# Patient Record
Sex: Male | Born: 1980 | Hispanic: Yes | Smoking: Current every day smoker
Health system: Southern US, Community
[De-identification: ages and names within clinical notes are randomized; demographics above are authoritative.]

## PROBLEM LIST (undated history)

## (undated) DIAGNOSIS — S92502B Displaced unspecified fracture of left lesser toe(s), initial encounter for open fracture: Secondary | ICD-10-CM

## (undated) HISTORY — PX: NO PAST SURGERIES: SHX2092

---

## 2016-03-17 ENCOUNTER — Encounter (HOSPITAL_COMMUNITY): Payer: Self-pay

## 2016-03-17 ENCOUNTER — Emergency Department (HOSPITAL_COMMUNITY): Payer: Self-pay

## 2016-03-17 DIAGNOSIS — S92502B Displaced unspecified fracture of left lesser toe(s), initial encounter for open fracture: Secondary | ICD-10-CM

## 2016-03-17 DIAGNOSIS — Y999 Unspecified external cause status: Secondary | ICD-10-CM | POA: Insufficient documentation

## 2016-03-17 DIAGNOSIS — Y929 Unspecified place or not applicable: Secondary | ICD-10-CM | POA: Insufficient documentation

## 2016-03-17 DIAGNOSIS — S92532B Displaced fracture of distal phalanx of left lesser toe(s), initial encounter for open fracture: Secondary | ICD-10-CM | POA: Insufficient documentation

## 2016-03-17 DIAGNOSIS — W311XXA Contact with metalworking machines, initial encounter: Secondary | ICD-10-CM | POA: Insufficient documentation

## 2016-03-17 DIAGNOSIS — Z23 Encounter for immunization: Secondary | ICD-10-CM | POA: Insufficient documentation

## 2016-03-17 DIAGNOSIS — Y9389 Activity, other specified: Secondary | ICD-10-CM | POA: Insufficient documentation

## 2016-03-17 HISTORY — DX: Displaced unspecified fracture of left lesser toe(s), initial encounter for open fracture: S92.502B

## 2016-03-17 NOTE — ED Triage Notes (Signed)
Pt was working today with a saw. The saw cut through the shoe on his left foot and lacerated his pinky toe. Bleeding controlled. A&Ox4.

## 2016-03-18 ENCOUNTER — Emergency Department (HOSPITAL_COMMUNITY)
Admission: EM | Admit: 2016-03-18 | Discharge: 2016-03-18 | Disposition: A | Discharge status: Home / Self Care | Payer: Self-pay | Attending: Emergency Medicine | Admitting: Emergency Medicine

## 2016-03-18 ENCOUNTER — Encounter (HOSPITAL_COMMUNITY): Payer: Self-pay

## 2016-03-18 DIAGNOSIS — Z5189 Encounter for other specified aftercare: Secondary | ICD-10-CM

## 2016-03-18 DIAGNOSIS — Z4801 Encounter for change or removal of surgical wound dressing: Secondary | ICD-10-CM | POA: Insufficient documentation

## 2016-03-18 DIAGNOSIS — S92502B Displaced unspecified fracture of left lesser toe(s), initial encounter for open fracture: Secondary | ICD-10-CM

## 2016-03-18 MED ORDER — CEPHALEXIN 500 MG PO CAPS: 500.0000 mg | ORAL_CAPSULE | Freq: Four times a day (QID) | ORAL | 0 refills | Status: DC

## 2016-03-18 MED ORDER — OXYCODONE-ACETAMINOPHEN 5-325 MG PO TABS: 1.0000 | ORAL_TABLET | Freq: Four times a day (QID) | ORAL | 0 refills | Status: DC | PRN

## 2016-03-18 MED ORDER — CEFAZOLIN IN D5W 1 GM/50ML IV SOLN
1.0000 g | Freq: Once | INTRAVENOUS | Status: AC
  Administered 2016-03-18: 1 g via INTRAVENOUS
  Filled 2016-03-18: qty 50

## 2016-03-18 MED ORDER — IBUPROFEN 400 MG PO TABS
600.0000 mg | ORAL_TABLET | Freq: Once | ORAL | Status: AC
  Administered 2016-03-18: 600 mg via ORAL
  Filled 2016-03-18: qty 1

## 2016-03-18 MED ORDER — TETANUS-DIPHTH-ACELL PERTUSSIS 5-2.5-18.5 LF-MCG/0.5 IM SUSP
0.5000 mL | Freq: Once | INTRAMUSCULAR | Status: AC
  Administered 2016-03-18: 0.5 mL via INTRAMUSCULAR
  Filled 2016-03-18: qty 0.5

## 2016-03-18 MED ORDER — CEPHALEXIN 250 MG PO CAPS
500.0000 mg | ORAL_CAPSULE | Freq: Once | ORAL | Status: AC
  Administered 2016-03-18: 500 mg via ORAL
  Filled 2016-03-18: qty 2

## 2016-03-18 MED ORDER — CIPROFLOXACIN HCL 500 MG PO TABS: 500.0000 mg | ORAL_TABLET | Freq: Two times a day (BID) | ORAL | 0 refills | Status: DC

## 2016-03-18 MED ORDER — OXYCODONE-ACETAMINOPHEN 5-325 MG PO TABS
1.0000 | ORAL_TABLET | Freq: Once | ORAL | Status: AC
  Administered 2016-03-18: 1 via ORAL
  Filled 2016-03-18: qty 1

## 2016-03-18 MED ORDER — LIDOCAINE HCL 2 % IJ SOLN
10.0000 mL | Freq: Once | INTRAMUSCULAR | Status: AC
  Administered 2016-03-18: 200 mg via INTRADERMAL
  Filled 2016-03-18: qty 20

## 2016-03-18 MED ORDER — HYDROMORPHONE HCL 1 MG/ML IJ SOLN
1.0000 mg | Freq: Once | INTRAMUSCULAR | Status: AC
  Administered 2016-03-18: 1 mg via INTRAVENOUS
  Filled 2016-03-18: qty 1

## 2016-03-18 NOTE — ED Triage Notes (Signed)
Pt states that he cut his pinky toe on L foot yesterday around 1130, with a wood cutter, seen yesterday and told to follow up with Ortho, but unable to follow up with ortho till tomorrow. Pt states the pain is unbearable, pt is spanish speaking only. Pain 8/10

## 2016-03-18 NOTE — ED Notes (Signed)
Pt is soaking foot in betadine and warm water, family at bedside

## 2016-03-18 NOTE — ED Provider Notes (Signed)
[facility]-EMERGENCY DEPT Provider Note   CSN: [id number] Arrival date & time: 03/17/16  2119  By signing my name below, I, Soijett Blue, attest that this documentation has been prepared under the direction and in the presence of Fayrene Helper, PA-C Electronically Signed: Soijett Blue, ED Scribe. 03/18/16. 1:26 AM.   History   Chief Complaint Chief Complaint  Patient presents with  . Extremity Laceration    HPI Trevor Dixon is a 35 y.o. male who presents to the Emergency Department complaining of left foot laceration onset >12 hrs ago. Pt notes that he was working with a saw when the saw cut through his shoe and cut his left pinky toe. He states that he is having associated symptoms of severe sharp throbbing pain. He states that he has tried cleaning with peroxide with no relief for his symptoms. He denies color change, joint swelling, numbness and any other symptoms. Denies taking blood thinners at this time. Denies allergies to any medications. He went to Urgent Care center but was sent here for further care.  Unable to recall last tetanus status.     The history is provided by the patient. No language interpreter was used.    History reviewed. No pertinent past medical history.  There are no active problems to display for this patient.   No past surgical history on file.     Home Medications    Prior to Admission medications   Not on File    Family History History reviewed. No pertinent family history.  Social History Social History  Substance Use Topics  . Smoking status: Not on file  . Smokeless tobacco: Not on file  . Alcohol use Not on file     Allergies   Review of patient's allergies indicates not on file.   Review of Systems Review of Systems  Skin: Positive for wound (left pinky toe laceration).  All other systems reviewed and are negative.    Physical Exam Updated Vital Signs BP 158/99   Pulse 71   Temp 98.7 F (37.1 C) (Oral)   Resp 18    SpO2 99%   Physical Exam  Constitutional: He appears well-developed and well-nourished. No distress.  HENT:  Head: Atraumatic.  Eyes: Conjunctivae are normal.  Neck: Neck supple.  Musculoskeletal: He exhibits tenderness (L pinky toe: 3cm deep laceration to lateral aspect of proximal toe. toe is mildly dusky in appearance.  Cap refill 2sec, toe is flexed, unable to extend toe.  sensation is intact.).  Neurological: He is alert.  Skin: No rash noted.  Psychiatric: He has a normal mood and affect.  Nursing note and vitals reviewed.          ED Treatments / Results  DIAGNOSTIC STUDIES: Oxygen Saturation is 99% on RA, nl by my interpretation.    COORDINATION OF CARE: 1:28 AM Discussed treatment plan with pt at bedside which includes left fifth toe xray, wound care and pt agreed to plan.   Labs (all labs ordered are listed, but only abnormal results are displayed) Labs Reviewed - No data to display  EKG  EKG Interpretation None       Radiology Dg Toe 5th Left  Result Date: 03/18/2016 CLINICAL DATA:  35 year old male with trauma to the left fifth toe with a chain saw. EXAM: DG TOE 5TH LEFT COMPARISON:  None. FINDINGS: There is an incomplete fracture of the lateral cortex of the distal aspect of the proximal phalanx of the fifth digit. Multiple small bony fragment noted in  the lateral soft tissues of the toe. There is no dislocation. No arthritic changes. There is soft tissue swelling and laceration of the skin of the fifth digit. IMPRESSION: Incomplete fracture of the distal aspect of the proximal phalanx of the fifth digit with multiple small bone fragments in the adjacent soft tissue. Electronically Signed   By: Elgie CollardArash  Radparvar M.D.   On: 03/18/2016 01:31    Procedures Procedures (including critical care time)  LACERATION REPAIR Performed by: Fayrene HelperRAN,Taro Hidrogo Authorized by: Fayrene HelperRAN,Eldon Zietlow Consent: Verbal consent obtained. Risks and benefits: risks, benefits and alternatives  were discussed Consent given by: patient Patient identity confirmed: provided demographic data Prepped and Draped in normal sterile fashion Wound explored  Laceration Location: L little toe, dorsal  Laceration Length: 3cm, deep with bony exposure and tendon laceration  No Foreign Bodies seen or palpated  Anesthesia: digital nerve block  Local anesthetic: lidocaine 2% w/o epinephrine  Anesthetic total: 6 ml  Irrigation method: syringe Amount of cleaning: standard  Skin closure: none  Number of sutures: none  Technique: thoroughly irrigated, and non occlusive dressing applied.  Patient tolerance: Patient tolerated the procedure well with no immediate complications.   Medications Ordered in ED Medications  lidocaine (XYLOCAINE) 2 % (with pres) injection 200 mg (not administered)  Tdap (BOOSTRIX) injection 0.5 mL (0.5 mLs Intramuscular Given 03/18/16 0240)  HYDROmorphone (DILAUDID) injection 1 mg (1 mg Intravenous Given 03/18/16 0243)  ceFAZolin (ANCEF) IVPB 1 g/50 mL premix (1 g Intravenous New Bag/Given 03/18/16 0324)     Initial Impression / Assessment and Plan / ED Course  I have reviewed the triage vital signs and the nursing notes.  Pertinent labs & imaging results that were available during my care of the patient were reviewed by me and considered in my medical decision making (see chart for details).  Clinical Course    BP 143/82   Pulse 65   Temp 98.7 F (37.1 C) (Oral)   Resp 18   SpO2 99%    Final Clinical Impressions(s) / ED Diagnoses   Final diagnoses:  Open fracture of fifth toe of left foot, initial encounter    New Prescriptions New Prescriptions   CIPROFLOXACIN (CIPRO) 500 MG TABLET    Take 1 tablet (500 mg total) by mouth 2 (two) times daily. One po bid x 7 days   OXYCODONE-ACETAMINOPHEN (PERCOCET/ROXICET) 5-325 MG TABLET    Take 1-2 tablets by mouth every 6 (six) hours as needed for moderate pain or severe pain.   I personally performed  the services described in this documentation, which was scribed in my presence. The recorded information has been reviewed and is accurate.     2:33 AM Pt suffered a laceration to his L little toe.  Xray demonstrates incomplete fracture of the distal aspect of the proximal phalanx of the fifth digit with multiple small bone fragments in the adjacent soft tissue.  Pt unable to extend his toe, which is concern for tendon/ligamentous injury.  Plan to consult ortho for further management.  Care discussed with Dr. Preston FleetingGlick  4:29 AM Appreciate consultation from orthopedist Dr. Roda ShuttersXu who request wound to be thoroughly cleansed, dressed and for pt to f/u outpt for further care.  Recommend pain medication and cipro coverage.  Pt also made aware there's a potential he may lose his toe.  Pt is aware and agree with plan.     Fayrene HelperBowie Tereka Thorley, PA-C 03/18/16 [phone]    Dione Boozeavid Glick, MD 03/18/16 [phone]

## 2016-03-18 NOTE — Discharge Instructions (Signed)
Continue the medications you are taking in addition to the ones we give you.  Follow up with the orthopedic doctor tomorrow as scheduled.

## 2016-03-18 NOTE — ED Provider Notes (Signed)
[facility]-EMERGENCY DEPT Provider Note   CSN: [id number] Arrival date & time: 03/18/16  1955   By signing my name below, I, Christel MormonMatthew Jamison, attest that this documentation has been prepared under the direction and in the presence of Kerrie BuffaloHope Ameria Sanjurjo, NP. Electronically Signed: Christel MormonMatthew Jamison, Scribe. 03/18/2016. 9:46 PM.   History   Chief Complaint Chief Complaint  Patient presents with  . Toe Injury   The history is provided by the patient and a relative. The history is limited by a language barrier. No language interpreter was used.   HPI Comments:  Trevor Dixon is a 35 y.o. male who presents to the Emergency Department s/p an injury to his 5th L toe that occurred yesterday at 1130. Pt reports that he sustained the injury from a wood cutter and is now having unbearable pain. [facility] and was told that he had a broken toe. Pt had an x-ray, had the wound cleaned, and was given hydrocodone and Cipro. Per daughter, pt came to the ED because of his severe, worsening pain today. Pt has not taken any ibuprofen. Pt has an appointment tomorrow with ortho.    Past Medical History:  Diagnosis Date  . Fracture of fifth toe, left, open 03/17/2016    There are no active problems to display for this patient.   Past Surgical History:  Procedure Laterality Date  . NO PAST SURGERIES         Home Medications    Prior to Admission medications   Medication Sig Start Date End Date Taking? Authorizing Provider  cephALEXin (KEFLEX) 500 MG capsule Take 1 capsule (500 mg total) by mouth 4 (four) times daily. 03/18/16   Witney Huie Orlene OchM Girtha Kilgore, NP  ciprofloxacin (CIPRO) 500 MG tablet Take 1 tablet (500 mg total) by mouth 2 (two) times daily. One po bid x 7 days 03/18/16   Fayrene HelperBowie Tran, PA-C  oxyCODONE-acetaminophen (PERCOCET/ROXICET) 5-325 MG tablet Take 1-2 tablets by mouth every 6 (six) hours as needed for moderate pain or severe pain. 03/18/16   Fayrene HelperBowie Tran, PA-C    Family History No family  history on file.  Social History Social History  Substance Use Topics  . Smoking status: Never Smoker  . Smokeless tobacco: Never Used  . Alcohol use Yes     Comment: occasionally     Allergies   Review of patient's allergies indicates no known allergies.   Review of Systems Review of Systems  Skin: Positive for wound.  All other systems reviewed and are negative.    Physical Exam Updated Vital Signs BP 136/84 (BP Location: Right Arm)   Pulse 78   Temp 98.8 F (37.1 C)   Resp 16   SpO2 99%   Physical Exam  Constitutional: He appears well-developed and well-nourished. No distress.  HENT:  Head: Normocephalic and atraumatic.  Eyes: Conjunctivae are normal.  Cardiovascular: Normal rate.   Pulmonary/Chest: Effort normal.  Abdominal: He exhibits no distension.  Neurological: He is alert.  Skin: Skin is warm and dry.  Swelling and laceration to dorsum of R little toe. Open wound. Erythema that extends to dorsum of foot but does not extend to the ankle.   Psychiatric: He has a normal mood and affect.  Nursing note and vitals reviewed.    ED Treatments / Results  DIAGNOSTIC STUDIES:  Oxygen Saturation is 100% on RA, normal by my interpretation.    COORDINATION OF CARE:  9:46 PM Discussed treatment plan with pt at bedside and pt  agreed to plan.   Labs (all labs ordered are listed, but only abnormal results are displayed) Labs Reviewed - No data to display  Radiology Dg Toe 5th Left  Result Date: 03/18/2016 CLINICAL DATA:  35 year old male with trauma to the left fifth toe with a chain saw. EXAM: DG TOE 5TH LEFT COMPARISON:  None. FINDINGS: There is an incomplete fracture of the lateral cortex of the distal aspect of the proximal phalanx of the fifth digit. Multiple small bony fragment noted in the lateral soft tissues of the toe. There is no dislocation. No arthritic changes. There is soft tissue swelling and laceration of the skin of the fifth digit.  IMPRESSION: Incomplete fracture of the distal aspect of the proximal phalanx of the fifth digit with multiple small bone fragments in the adjacent soft tissue. Electronically Signed   By: Elgie Collard M.D.   On: 03/18/2016 01:31    Procedures Procedures (including critical care time)  Medications Ordered in ED Medications  cephALEXin (KEFLEX) capsule 500 mg (500 mg Oral Given 03/18/16 2246)  oxyCODONE-acetaminophen (PERCOCET/ROXICET) 5-325 MG per tablet 1 tablet (1 tablet Oral Given 03/18/16 2246)  ibuprofen (ADVIL,MOTRIN) tablet 600 mg (600 mg Oral Given 03/18/16 2246)     Initial Impression / Assessment and Plan / ED Course  I have reviewed the triage vital signs and the nursing notes.  Pertinent labs & imaging results that were available during my care of the patient were reviewed by me and considered in my medical decision making (see chart for details).  Clinical Course   Soaked the foot in NSS, and cleaned the wound, dressing reapplied, pain management and keep appointment with Ortho will add Keflex to the current antibiotic. Final Clinical Impressions(s) / ED Diagnoses  35 y.o. male stable for d/c.  Final diagnoses:  Visit for wound check    New Prescriptions Discharge Medication List as of 03/18/2016 10:59 PM    START taking these medications   Details  cephALEXin (KEFLEX) 500 MG capsule Take 1 capsule (500 mg total) by mouth 4 (four) times daily., Starting Wed 03/18/2016, Print      I personally performed the services described in this documentation, which was scribed in my presence. The recorded information has been reviewed and is accurate.     [place], [place] 03/19/16 1554    Maia Plan, MD 03/19/16 [phone]

## 2016-03-18 NOTE — ED Provider Notes (Deleted)
35 year old male suffered a laceration to his left fifth toe with a chainsaw. On exam, there is laceration over the lateral dorsal aspect of the left fifth toe. X-ray does show a fracture so we will need to be treated as an open fracture. Is given antibiotics and tetanus immunization. He'll be given antibiotics and referred to orthopedics.  Medical screening examination/treatment/procedure(s) were conducted as a shared visit with non-physician practitioner(s) and myself.  I personally evaluated the patient during the encounter.     Dione Boozeavid Shatasia Cutshaw, MD 03/18/16 [phone]

## 2016-03-18 NOTE — ED Notes (Signed)
Pt wheeled to significant other's car.  Used interpreter to discuss discharge instructions.  Verbalized understanding of discharge instructions.

## 2016-03-18 NOTE — ED Notes (Signed)
See np assessment 

## 2016-03-18 NOTE — ED Notes (Signed)
Lidocaine and laceration tray at bedside if needed.

## 2016-03-18 NOTE — Discharge Instructions (Signed)
You have a broken left toe.  Please take antibiotic as prescribe and pain medication as needed.  Follow up with orthopedist Dr. Roda ShuttersXu today or tomorrow for further care.

## 2016-03-19 ENCOUNTER — Other Ambulatory Visit: Payer: Self-pay | Admitting: Orthopaedic Surgery

## 2016-03-19 ENCOUNTER — Encounter (HOSPITAL_BASED_OUTPATIENT_CLINIC_OR_DEPARTMENT_OTHER): Payer: Self-pay | Admitting: *Deleted

## 2016-03-19 NOTE — Pre-Procedure Instructions (Signed)
Marlene will be interpreter for pt., per Judy at [facility]; please call [phone] if surgery time changes. 

## 2016-03-20 ENCOUNTER — Encounter (HOSPITAL_BASED_OUTPATIENT_CLINIC_OR_DEPARTMENT_OTHER)
Admission: RE | Disposition: A | Discharge status: Home / Self Care | Payer: Self-pay | Source: Ambulatory Visit | Attending: Orthopaedic Surgery

## 2016-03-20 ENCOUNTER — Encounter (HOSPITAL_BASED_OUTPATIENT_CLINIC_OR_DEPARTMENT_OTHER): Payer: Self-pay | Admitting: Certified Registered"

## 2016-03-20 ENCOUNTER — Ambulatory Visit (HOSPITAL_BASED_OUTPATIENT_CLINIC_OR_DEPARTMENT_OTHER): Payer: Self-pay | Admitting: Certified Registered"

## 2016-03-20 ENCOUNTER — Ambulatory Visit (HOSPITAL_COMMUNITY)
Admission: RE | Admit: 2016-03-20 | Discharge: 2016-03-20 | Disposition: A | Discharge status: Home / Self Care | Payer: Self-pay | Source: Ambulatory Visit | Attending: Orthopaedic Surgery | Admitting: Orthopaedic Surgery

## 2016-03-20 DIAGNOSIS — Z79899 Other long term (current) drug therapy: Secondary | ICD-10-CM | POA: Insufficient documentation

## 2016-03-20 DIAGNOSIS — Y939 Activity, unspecified: Secondary | ICD-10-CM | POA: Insufficient documentation

## 2016-03-20 DIAGNOSIS — X58XXXA Exposure to other specified factors, initial encounter: Secondary | ICD-10-CM | POA: Insufficient documentation

## 2016-03-20 DIAGNOSIS — S92512B Displaced fracture of proximal phalanx of left lesser toe(s), initial encounter for open fracture: Secondary | ICD-10-CM | POA: Insufficient documentation

## 2016-03-20 HISTORY — DX: Displaced unspecified fracture of left lesser toe(s), initial encounter for open fracture: S92.502B

## 2016-03-20 HISTORY — PX: I & D EXTREMITY: SHX5045

## 2016-03-20 SURGERY — IRRIGATION AND DEBRIDEMENT EXTREMITY
Anesthesia: General | Site: Foot | Laterality: Left

## 2016-03-20 MED ORDER — DEXAMETHASONE SODIUM PHOSPHATE 10 MG/ML IJ SOLN
INTRAMUSCULAR | Status: DC | PRN
  Administered 2016-03-20: 10 mg via INTRAVENOUS

## 2016-03-20 MED ORDER — ONDANSETRON HCL 4 MG/2ML IJ SOLN
INTRAMUSCULAR | Status: AC
  Filled 2016-03-20: qty 2

## 2016-03-20 MED ORDER — HYDROMORPHONE HCL 1 MG/ML IJ SOLN
INTRAMUSCULAR | Status: AC
  Filled 2016-03-20: qty 1

## 2016-03-20 MED ORDER — CIPROFLOXACIN IN D5W 400 MG/200ML IV SOLN
400.0000 mg | Freq: Once | INTRAVENOUS | Status: AC
  Administered 2016-03-20: 400 mg via INTRAVENOUS

## 2016-03-20 MED ORDER — BUPIVACAINE HCL 0.25 % IJ SOLN
INTRAMUSCULAR | Status: DC | PRN
  Administered 2016-03-20: 2 mL

## 2016-03-20 MED ORDER — MUPIROCIN 2 % EX OINT
TOPICAL_OINTMENT | CUTANEOUS | Status: AC
  Filled 2016-03-20: qty 22

## 2016-03-20 MED ORDER — SODIUM CHLORIDE 0.9 % IR SOLN
Status: DC | PRN
  Administered 2016-03-20: 3000 mL

## 2016-03-20 MED ORDER — OXYCODONE HCL 5 MG PO TABS
ORAL_TABLET | ORAL | Status: AC
  Filled 2016-03-20: qty 1

## 2016-03-20 MED ORDER — MIDAZOLAM HCL 2 MG/2ML IJ SOLN
1.0000 mg | INTRAMUSCULAR | Status: DC | PRN
  Administered 2016-03-20: 2 mg via INTRAVENOUS

## 2016-03-20 MED ORDER — ONDANSETRON HCL 4 MG PO TABS: 4.0000 mg | ORAL_TABLET | Freq: Three times a day (TID) | ORAL | 0 refills | Status: DC | PRN

## 2016-03-20 MED ORDER — LIDOCAINE HCL (CARDIAC) 20 MG/ML IV SOLN
INTRAVENOUS | Status: DC | PRN
  Administered 2016-03-20: 60 mg via INTRAVENOUS

## 2016-03-20 MED ORDER — GLYCOPYRROLATE 0.2 MG/ML IJ SOLN: 0.2000 mg | Freq: Once | INTRAMUSCULAR | Status: DC | PRN

## 2016-03-20 MED ORDER — MIDAZOLAM HCL 2 MG/2ML IJ SOLN
INTRAMUSCULAR | Status: AC
  Filled 2016-03-20: qty 2

## 2016-03-20 MED ORDER — HYDROCODONE-ACETAMINOPHEN 7.5-325 MG PO TABS: 1.0000 | ORAL_TABLET | Freq: Four times a day (QID) | ORAL | 0 refills | Status: DC | PRN

## 2016-03-20 MED ORDER — OXYCODONE HCL 5 MG PO TABS
5.0000 mg | ORAL_TABLET | Freq: Once | ORAL | Status: AC
  Administered 2016-03-20: 5 mg via ORAL

## 2016-03-20 MED ORDER — FENTANYL CITRATE (PF) 100 MCG/2ML IJ SOLN
50.0000 ug | INTRAMUSCULAR | Status: DC | PRN
  Administered 2016-03-20 (×2): 50 ug via INTRAVENOUS

## 2016-03-20 MED ORDER — SENNOSIDES-DOCUSATE SODIUM 8.6-50 MG PO TABS: 1.0000 | ORAL_TABLET | Freq: Every evening | ORAL | 1 refills | Status: DC | PRN

## 2016-03-20 MED ORDER — HYDROMORPHONE HCL 1 MG/ML IJ SOLN
0.2500 mg | INTRAMUSCULAR | Status: DC | PRN
  Administered 2016-03-20 (×3): 0.5 mg via INTRAVENOUS

## 2016-03-20 MED ORDER — FENTANYL CITRATE (PF) 100 MCG/2ML IJ SOLN
INTRAMUSCULAR | Status: AC
  Filled 2016-03-20: qty 2

## 2016-03-20 MED ORDER — CIPROFLOXACIN IN D5W 400 MG/200ML IV SOLN
INTRAVENOUS | Status: AC
  Filled 2016-03-20: qty 200

## 2016-03-20 MED ORDER — PROPOFOL 10 MG/ML IV BOLUS
INTRAVENOUS | Status: DC | PRN
  Administered 2016-03-20: 200 mg via INTRAVENOUS

## 2016-03-20 MED ORDER — LIDOCAINE 2% (20 MG/ML) 5 ML SYRINGE
INTRAMUSCULAR | Status: AC
  Filled 2016-03-20: qty 5

## 2016-03-20 MED ORDER — ONDANSETRON HCL 4 MG/2ML IJ SOLN
INTRAMUSCULAR | Status: DC | PRN
  Administered 2016-03-20: 4 mg via INTRAVENOUS

## 2016-03-20 MED ORDER — SCOPOLAMINE 1 MG/3DAYS TD PT72: 1.0000 | MEDICATED_PATCH | Freq: Once | TRANSDERMAL | Status: DC | PRN

## 2016-03-20 MED ORDER — DEXAMETHASONE SODIUM PHOSPHATE 10 MG/ML IJ SOLN
INTRAMUSCULAR | Status: AC
  Filled 2016-03-20: qty 1

## 2016-03-20 MED ORDER — CEFAZOLIN SODIUM-DEXTROSE 2-4 GM/100ML-% IV SOLN: 2.0000 g | INTRAVENOUS | Status: DC

## 2016-03-20 MED ORDER — LACTATED RINGERS IV SOLN
INTRAVENOUS | Status: DC
  Administered 2016-03-20 (×2): via INTRAVENOUS

## 2016-03-20 SURGICAL SUPPLY — 58 items
BANDAGE ACE 4X5 VEL STRL LF (GAUZE/BANDAGES/DRESSINGS) IMPLANT
BLADE HEX COATED 2.75 (ELECTRODE) IMPLANT
BLADE SURG 15 STRL LF DISP TIS (BLADE) ×2 IMPLANT
BLADE SURG 15 STRL SS (BLADE) ×4
CANISTER SUCT 1200ML W/VALVE (MISCELLANEOUS) ×3 IMPLANT
COVER BACK TABLE 60X90IN (DRAPES) ×3 IMPLANT
CUFF TOURNIQUET SINGLE 24IN (TOURNIQUET CUFF) IMPLANT
CUFF TOURNIQUET SINGLE 34IN LL (TOURNIQUET CUFF) IMPLANT
DECANTER SPIKE VIAL GLASS SM (MISCELLANEOUS) IMPLANT
DRAIN PENROSE 1/2X12 LTX STRL (WOUND CARE) IMPLANT
DRAIN PENROSE 1/4X12 LTX STRL (WOUND CARE) IMPLANT
DRAPE EXTREMITY T 121X128X90 (DRAPE) ×3 IMPLANT
DRAPE IMP U-DRAPE 54X76 (DRAPES) ×3 IMPLANT
DRAPE SURG 17X23 STRL (DRAPES) IMPLANT
DRAPE U-SHAPE 47X51 STRL (DRAPES) IMPLANT
DURAPREP 26ML APPLICATOR (WOUND CARE) ×3 IMPLANT
ELECT REM PT RETURN 9FT ADLT (ELECTROSURGICAL) ×3
ELECTRODE REM PT RTRN 9FT ADLT (ELECTROSURGICAL) ×1 IMPLANT
GAUZE IODOFORM PACK 1/2 7832 (GAUZE/BANDAGES/DRESSINGS) IMPLANT
GAUZE PACKING IODOFORM 1/4X5 (PACKING) IMPLANT
GAUZE SPONGE 4X4 12PLY STRL (GAUZE/BANDAGES/DRESSINGS) ×3 IMPLANT
GAUZE XEROFORM 1X8 LF (GAUZE/BANDAGES/DRESSINGS) ×3 IMPLANT
GLOVE SKINSENSE NS SZ7.5 (GLOVE) ×2
GLOVE SKINSENSE STRL SZ7.5 (GLOVE) ×1 IMPLANT
GLOVE SURG SYN 7.5  E (GLOVE) ×2
GLOVE SURG SYN 7.5 E (GLOVE) ×1 IMPLANT
GOWN STRL REIN XL XLG (GOWN DISPOSABLE) ×3 IMPLANT
GOWN STRL REUS W/ TWL LRG LVL3 (GOWN DISPOSABLE) ×1 IMPLANT
GOWN STRL REUS W/TWL LRG LVL3 (GOWN DISPOSABLE) ×2
MANIFOLD NEPTUNE II (INSTRUMENTS) ×3 IMPLANT
NEEDLE HYPO 22GX1.5 SAFETY (NEEDLE) IMPLANT
NS IRRIG 1000ML POUR BTL (IV SOLUTION) ×3 IMPLANT
PACK BASIN DAY SURGERY FS (CUSTOM PROCEDURE TRAY) ×3 IMPLANT
PAD CAST 3X4 CTTN HI CHSV (CAST SUPPLIES) IMPLANT
PAD CAST 4YDX4 CTTN HI CHSV (CAST SUPPLIES) IMPLANT
PADDING CAST COTTON 3X4 STRL (CAST SUPPLIES)
PADDING CAST COTTON 4X4 STRL (CAST SUPPLIES)
PADDING CAST SYN 6 (CAST SUPPLIES)
PADDING CAST SYNTHETIC 4 (CAST SUPPLIES)
PADDING CAST SYNTHETIC 4X4 STR (CAST SUPPLIES) IMPLANT
PADDING CAST SYNTHETIC 6X4 NS (CAST SUPPLIES) IMPLANT
PENCIL BUTTON HOLSTER BLD 10FT (ELECTRODE) ×3 IMPLANT
SET IRRIG Y TYPE TUR BLADDER L (SET/KITS/TRAYS/PACK) ×3 IMPLANT
SLEEVE SCD COMPRESS KNEE MED (MISCELLANEOUS) IMPLANT
SPONGE LAP 18X18 X RAY DECT (DISPOSABLE) IMPLANT
STAPLER VISISTAT (STAPLE) IMPLANT
STOCKINETTE 4X48 STRL (DRAPES) IMPLANT
STOCKINETTE 6  STRL (DRAPES)
STOCKINETTE 6 STRL (DRAPES) IMPLANT
SUT ETHILON 3 0 PS 1 (SUTURE) IMPLANT
SUT VIC AB 2-0 CT1 27 (SUTURE)
SUT VIC AB 2-0 CT1 TAPERPNT 27 (SUTURE) IMPLANT
SWAB CULTURE ESWAB REG 1ML (MISCELLANEOUS) IMPLANT
SYR BULB 3OZ (MISCELLANEOUS) ×3 IMPLANT
SYR CONTROL 10ML LL (SYRINGE) IMPLANT
TOWEL OR 17X24 6PK STRL BLUE (TOWEL DISPOSABLE) ×3 IMPLANT
TUBE ANAEROBIC SPECIMEN COL (MISCELLANEOUS) IMPLANT
UNDERPAD 30X30 (UNDERPADS AND DIAPERS) ×3 IMPLANT

## 2016-03-20 NOTE — Discharge Instructions (Signed)
Post Anesthesia Home Care Instructions  Activity: Get plenty of rest for the remainder of the day. A responsible adult should stay with you for 24 hours following the procedure.  For the next 24 hours, DO NOT: -Drive a car -Advertising copywriter -Drink alcoholic beverages -Take any medication unless instructed by your physician -Make any legal decisions or sign important papers.  Meals: Start with liquid foods such as gelatin or soup. Progress to regular foods as tolerated. Avoid greasy, spicy, heavy foods. If nausea and/or vomiting occur, drink only clear liquids until the nausea and/or vomiting subsides. Call your physician if vomiting continues.  Special Instructions/Symptoms: Your throat may feel dry or sore from the anesthesia or the breathing tube placed in your throat during surgery. If this causes discomfort, gargle with warm salt water. The discomfort should disappear within 24 hours.  If you had a scopolamine patch placed behind your ear for the management of post- operative nausea and/or vomiting:  1. The medication in the patch is effective for 72 hours, after which it should be removed.  Wrap patch in a tissue and discard in the trash. Wash hands thoroughly with soap and water. 2. You may remove the patch earlier than 72 hours if you experience unpleasant side effects which may include dry mouth, dizziness or visual disturbances. 3. Avoid touching the patch. Wash your hands with soap and water after contact with the patch.   Postoperative instructions:  Weightbearing: as tolerated in postop shoe  Dressing instructions: Keep your dressing and/or splint clean and dry at all times.  It will be removed at your first post-operative appointment.  Your stitches and/or staples will be removed at this visit.  Incision instructions:  Do not soak your incision for 3 weeks after surgery.  If the incision gets wet, pat dry and do not scrub the incision.  Pain control:  You have been  given a prescription to be taken as directed for post-operative pain control.  In addition, elevate the operative extremity above the heart at all times to prevent swelling and throbbing pain.  Take over-the-counter Colace, 100mg  by mouth twice a day while taking narcotic pain medications to help prevent constipation.  Follow up appointments: 1) 10-14 days for suture removal and wound check. 2) Dr. Roda Shutters as scheduled.   -------------------------------------------------------------------------------------------------------------  After Surgery Pain Control:  After your surgery, post-surgical discomfort or pain is likely. This discomfort can last several days to a few weeks. At certain times of the day your discomfort may be more intense.  Did you receive a nerve block?  A nerve block can provide pain relief for one hour to two days after your surgery. As long as the nerve block is working, you will experience little or no sensation in the area the surgeon operated on.  As the nerve block wears off, you will begin to experience pain or discomfort. It is very important that you begin taking your prescribed pain medication before the nerve block fully wears off. Treating your pain at the first sign of the block wearing off will ensure your pain is better controlled and more tolerable when full-sensation returns. Do not wait until the pain is intolerable, as the medicine will be less effective. It is better to treat pain in advance than to try and catch up.  General Anesthesia:  If you did not receive a nerve block during your surgery, you will need to start taking your pain medication shortly after your surgery and should continue to do so  as prescribed by your surgeon.  Pain Medication:  Most commonly we prescribe Vicodin and Percocet for post-operative pain. Both of these medications contain a combination of acetaminophen (Tylenol) and a narcotic to help control pain.   It takes between 30 and 45  minutes before pain medication starts to work. It is important to take your medication before your pain level gets too intense.   Nausea is a common side effect of many pain medications. You will want to eat something before taking your pain medicine to help prevent nausea.   If you are taking a prescription pain medication that contains acetaminophen, we recommend that you do not take additional over the counter acetaminophen (Tylenol).  Other pain relieving options:   Using a cold pack to ice the affected area a few times a day (15 to 20 minutes at a time) can help to relieve pain, reduce swelling and bruising.   Elevation of the affected area can also help to reduce pain and swelling.

## 2016-03-20 NOTE — Anesthesia Postprocedure Evaluation (Signed)
Anesthesia Post Note  Patient: Trevor Dixon  Procedure(s) Performed: Procedure(s) (LRB): IRRIGATION AND DEBRIDEMENT LEFT FOOT (Left)  Patient location during evaluation: PACU Anesthesia Type: General Level of consciousness: awake and alert Pain management: pain level controlled Vital Signs Assessment: post-procedure vital signs reviewed and stable Respiratory status: spontaneous breathing, nonlabored ventilation and respiratory function stable Cardiovascular status: blood pressure returned to baseline and stable Postop Assessment: no signs of nausea or vomiting Anesthetic complications: no    Last Vitals:  Vitals:   03/20/16 1123 03/20/16 1200  BP: (!) 138/94 (!) 157/100  Pulse:  66  Resp: 12 14  Temp:  36.9 C    Last Pain:  Vitals:   03/20/16 1200  TempSrc:   PainSc: 2                  Cythia Bachtel,W. EDMOND

## 2016-03-20 NOTE — Op Note (Addendum)
   Date of Surgery: 03/20/2016  INDICATIONS: Mr. Trevor Dixon is a 35 y.o.-year-old male with a left open fracture of small toe;  The patient did consent to the procedure after discussion of the risks and benefits.  PREOPERATIVE DIAGNOSIS: Left small toe, open fracture of proximal phalanx with extensor tendon loss  POSTOPERATIVE DIAGNOSIS: Same.  PROCEDURE: 1. Debridement of bone, skin, subcutaneous tissue associated with open fracture of left small toe 2. Complex wound repair left small toe, 5 cm 3. Open treatment of left great toe open fracture  SURGEON: N. Glee ArvinMichael Tiarah Shisler, M.D.  ASSIST: none.  ANESTHESIA:  general  IV FLUIDS AND URINE: See anesthesia.  ESTIMATED BLOOD LOSS: minimal mL.  IMPLANTS: none  DRAINS: none  COMPLICATIONS: None.  DESCRIPTION OF PROCEDURE: The patient was brought to the operating room and placed supine on the operating table.  The patient had been signed prior to the procedure and this was documented. The patient had the anesthesia placed by the anesthesiologist.  A time-out was performed to confirm that this was the correct patient, site, side and location. The patient did receive antibiotics prior to the incision and was re-dosed during the procedure as needed at indicated intervals.  A tourniquet was placed.  The patient had the operative extremity prepped and draped in the standard surgical fashion.   I first performed sharp excisional debridement of the skin, subcutaneous tissue, bone with a knife and Roger. There is obvious extensor tendon damage but there was significant amount of tissue loss to wear a repair was not feasible. After debridement the wound was then thoroughly irrigated with normal saline. The tourniquet was deflated and all tissue surfaces had good bleeding. I then performed complex wound repair of the traumatic laceration of approximately 5 cm with interrupted 4-0 nylon sutures. Sterile dressings were applied. Patient tolerated the procedure well  and no immediate complications.  POSTOPERATIVE PLAN: weight bearing as tolerated. Discharge home. Continue cipro.  Mayra ReelN. Michael Cyan Clippinger, MD [facility] [phone] 9:49 AM

## 2016-03-20 NOTE — Transfer of Care (Signed)
Immediate Anesthesia Transfer of Care Note  Patient: Trevor Dixon  Procedure(s) Performed: Procedure(s) with comments: IRRIGATION AND DEBRIDEMENT LEFT FOOT (Left) - IRRIGATION AND DEBRIDEMENT LEFT FOOT  Patient Location: PACU  Anesthesia Type:General  Level of Consciousness: awake and patient cooperative  Airway & Oxygen Therapy: Patient Spontanous Breathing and Patient connected to face mask oxygen  Post-op Assessment: Report given to RN and Post -op Vital signs reviewed and stable  Post vital signs: Reviewed and stable  Last Vitals:  Vitals:   03/20/16 0804  BP: 131/77  Pulse: 66  Resp: 18  Temp: 36.8 C    Last Pain:  Vitals:   03/20/16 0804  TempSrc: Oral  PainSc: 6       Patients Stated Pain Goal: 2 (03/20/16 0804)  Complications: No apparent anesthesia complications

## 2016-03-20 NOTE — H&P (Signed)
    PREOPERATIVE H&P  Chief Complaint: left 5th toe open fracture  HPI: Trevor Dixon is a 35 y.o. male who presents for surgical treatment of left 5th toe open fracture.  He denies any changes in medical history.  Past Medical History:  Diagnosis Date  . Fracture of fifth toe, left, open 03/17/2016   Past Surgical History:  Procedure Laterality Date  . NO PAST SURGERIES     Social History   Social History  . Marital status: Married    Spouse name: N/A  . Number of children: N/A  . Years of education: N/A   Social History Main Topics  . Smoking status: Never Smoker  . Smokeless tobacco: Never Used  . Alcohol use Yes     Comment: occasionally  . Drug use: No  . Sexual activity: Not Asked   Other Topics Concern  . None   Social History Narrative  . None   History reviewed. No pertinent family history. No Known Allergies Prior to Admission medications   Medication Sig Start Date End Date Taking? Authorizing Provider  ciprofloxacin (CIPRO) 500 MG tablet Take 1 tablet (500 mg total) by mouth 2 (two) times daily. One po bid x 7 days 03/18/16  Yes Fayrene HelperBowie Tran, PA-C  oxyCODONE-acetaminophen (PERCOCET/ROXICET) 5-325 MG tablet Take 1-2 tablets by mouth every 6 (six) hours as needed for moderate pain or severe pain. 03/18/16  Yes Fayrene HelperBowie Tran, PA-C  cephALEXin (KEFLEX) 500 MG capsule Take 1 capsule (500 mg total) by mouth 4 (four) times daily. 03/18/16   Hope Orlene OchM Neese, NP     Positive ROS: All other systems have been reviewed and were otherwise negative with the exception of those mentioned in the HPI and as above.  Physical Exam: General: Alert, no acute distress Cardiovascular: No pedal edema Respiratory: No cyanosis, no use of accessory musculature GI: abdomen soft Skin: No lesions in the area of chief complaint Neurologic: Sensation intact distally Psychiatric: Patient is competent for consent with normal mood and affect Lymphatic: no lymphedema  MUSCULOSKELETAL:  exam stable  Assessment: left 5th toe open fracture  Plan: Plan for Procedure(s): IRRIGATION AND DEBRIDEMENT LEFT FOOT  The risks benefits and alternatives were discussed with the patient including but not limited to the risks of nonoperative treatment, versus surgical intervention including infection, bleeding, nerve injury,  blood clots, cardiopulmonary complications, morbidity, mortality, among others, and they were willing to proceed.   Cheral AlmasXu, Velvie Thomaston Michael, MD   03/20/2016 6:58 AM

## 2016-03-20 NOTE — Anesthesia Preprocedure Evaluation (Addendum)
Anesthesia Evaluation  Patient identified by MRN, date of birth, ID band Patient awake    Reviewed: Allergy & Precautions, H&P , NPO status , Patient's Chart, lab work & pertinent test results  Airway Mallampati: III  TM Distance: >3 FB Neck ROM: Full    Dental no notable dental hx. (+) Teeth Intact, Dental Advisory Given   Pulmonary neg pulmonary ROS,    Pulmonary exam normal breath sounds clear to auscultation       Cardiovascular negative cardio ROS   Rhythm:Regular Rate:Normal     Neuro/Psych negative neurological ROS  negative psych ROS   GI/Hepatic negative GI ROS, Neg liver ROS,   Endo/Other  negative endocrine ROS  Renal/GU negative Renal ROS  negative genitourinary   Musculoskeletal   Abdominal   Peds  Hematology negative hematology ROS (+)   Anesthesia Other Findings   Reproductive/Obstetrics negative OB ROS                            Anesthesia Physical Anesthesia Plan  ASA: I  Anesthesia Plan: General   Post-op Pain Management:    Induction: Intravenous  Airway Management Planned: LMA  Additional Equipment:   Intra-op Plan:   Post-operative Plan: Extubation in OR  Informed Consent: I have reviewed the patients History and Physical, chart, labs and discussed the procedure including the risks, benefits and alternatives for the proposed anesthesia with the patient or authorized representative who has indicated his/her understanding and acceptance.   Dental advisory given  Plan Discussed with: CRNA  Anesthesia Plan Comments:         Anesthesia Quick Evaluation

## 2016-03-20 NOTE — Anesthesia Procedure Notes (Signed)
Procedure Name: LMA Insertion Date/Time: 03/20/2016 9:14 AM Performed by: Tedric Leeth D Pre-anesthesia Checklist: Patient identified, Emergency Drugs available, Suction available and Patient being monitored Patient Re-evaluated:Patient Re-evaluated prior to inductionOxygen Delivery Method: Circle system utilized Preoxygenation: Pre-oxygenation with 100% oxygen Intubation Type: IV induction Ventilation: Mask ventilation without difficulty LMA: LMA inserted LMA Size: 4.0 Number of attempts: 1 Airway Equipment and Method: Bite block Placement Confirmation: positive ETCO2 Tube secured with: Tape Dental Injury: Teeth and Oropharynx as per pre-operative assessment

## 2016-03-23 ENCOUNTER — Encounter (HOSPITAL_BASED_OUTPATIENT_CLINIC_OR_DEPARTMENT_OTHER): Payer: Self-pay | Admitting: Orthopaedic Surgery

## 2016-03-26 ENCOUNTER — Ambulatory Visit (INDEPENDENT_AMBULATORY_CARE_PROVIDER_SITE_OTHER): Payer: Self-pay | Admitting: Orthopaedic Surgery

## 2016-03-26 DIAGNOSIS — S92402D Displaced unspecified fracture of left great toe, subsequent encounter for fracture with routine healing: Secondary | ICD-10-CM

## 2016-04-02 ENCOUNTER — Ambulatory Visit (INDEPENDENT_AMBULATORY_CARE_PROVIDER_SITE_OTHER): Payer: Self-pay | Admitting: Orthopaedic Surgery

## 2016-04-02 DIAGNOSIS — S92402D Displaced unspecified fracture of left great toe, subsequent encounter for fracture with routine healing: Secondary | ICD-10-CM

## 2016-04-30 ENCOUNTER — Encounter (INDEPENDENT_AMBULATORY_CARE_PROVIDER_SITE_OTHER): Payer: Self-pay | Admitting: Orthopaedic Surgery

## 2016-04-30 ENCOUNTER — Ambulatory Visit (INDEPENDENT_AMBULATORY_CARE_PROVIDER_SITE_OTHER): Payer: Self-pay | Admitting: Orthopaedic Surgery

## 2016-04-30 DIAGNOSIS — S91115D Laceration without foreign body of left lesser toe(s) without damage to nail, subsequent encounter: Secondary | ICD-10-CM

## 2016-04-30 DIAGNOSIS — S91115A Laceration without foreign body of left lesser toe(s) without damage to nail, initial encounter: Secondary | ICD-10-CM | POA: Insufficient documentation

## 2016-04-30 NOTE — Progress Notes (Signed)
   Office Visit Note   Patient: Trevor GlennFrancisco Hamberger           Date of Birth: 11/14/1980           MRN: [id number] Visit Date: 04/30/2016              Requested by: No referring provider defined for this encounter. PCP: No PCP Per Patient   Assessment & Plan: Visit Diagnoses: No diagnosis found.  Plan: At this point the patient has reached MMI. He is released for full duty. Questions encouraged and answered.  Follow-Up Instructions: Return if symptoms worsen or fail to improve.   Orders:  No orders of the defined types were placed in this encounter.  No orders of the defined types were placed in this encounter.     Procedures: No procedures performed   Clinical Data: No additional findings.   Subjective: Chief Complaint  Patient presents with  . Left 5th Toe - Follow-up, Pain    03/20/16- LEFT FOOT I & D  SMALL TOE.    HPI The patient is approximately 6 weeks status post I&D of left small toe open injury with tendon loss. He states that he is back to normal. He does not have any pain. The skin is slightly darkly discolored but this does not bother him. He denies any fevers or chills. Review of Systems   Objective: Vital Signs: There were no vitals taken for this visit.  Physical Exam  Ortho Exam Laceration and surgical scars have all healed up nicely. There is no signs of infection. There is slightly darkish view of the entire toe. The toe is warm well-perfused. Specialty Comments:  No specialty comments available.  Imaging: No results found.   PMFS History: There are no active problems to display for this patient.  Past Medical History:  Diagnosis Date  . Fracture of fifth toe, left, open 03/17/2016    History reviewed. No pertinent family history.  Past Surgical History:  Procedure Laterality Date  . I&D EXTREMITY Left 03/20/2016   Procedure: IRRIGATION AND DEBRIDEMENT LEFT FOOT;  Surgeon: Tarry KosNaiping M Mehmet Scally, MD;  Location: [facility];   Service: Orthopedics;  Laterality: Left;  IRRIGATION AND DEBRIDEMENT LEFT FOOT  . NO PAST SURGERIES     Social History   Occupational History  . Not on file.   Social History Main Topics  . Smoking status: Never Smoker  . Smokeless tobacco: Never Used  . Alcohol use Yes     Comment: occasionally  . Drug use: No  . Sexual activity: Not on file

## 2016-07-13 ENCOUNTER — Observation Stay (HOSPITAL_COMMUNITY)
Admission: EM | Admit: 2016-07-13 | Discharge: 2016-07-14 | Disposition: A | Discharge status: Home / Self Care | Payer: Self-pay | Attending: Ophthalmology | Admitting: Ophthalmology

## 2016-07-13 ENCOUNTER — Encounter (HOSPITAL_COMMUNITY): Payer: Self-pay

## 2016-07-13 ENCOUNTER — Ambulatory Visit (INDEPENDENT_AMBULATORY_CARE_PROVIDER_SITE_OTHER): Payer: Self-pay | Admitting: Emergency Medicine

## 2016-07-13 ENCOUNTER — Emergency Department (HOSPITAL_COMMUNITY): Payer: Self-pay

## 2016-07-13 VITALS — BP 144/92 | HR 84 | Temp 97.2°F | Resp 16 | Ht 70.0 in | Wt 164.2 lb

## 2016-07-13 DIAGNOSIS — S0531XA Ocular laceration without prolapse or loss of intraocular tissue, right eye, initial encounter: Secondary | ICD-10-CM | POA: Diagnosis present

## 2016-07-13 DIAGNOSIS — W228XXA Striking against or struck by other objects, initial encounter: Secondary | ICD-10-CM | POA: Insufficient documentation

## 2016-07-13 DIAGNOSIS — S0521XA Ocular laceration and rupture with prolapse or loss of intraocular tissue, right eye, initial encounter: Principal | ICD-10-CM | POA: Insufficient documentation

## 2016-07-13 DIAGNOSIS — S0530XA Ocular laceration without prolapse or loss of intraocular tissue, unspecified eye, initial encounter: Secondary | ICD-10-CM

## 2016-07-13 DIAGNOSIS — S0511XA Contusion of eyeball and orbital tissues, right eye, initial encounter: Secondary | ICD-10-CM

## 2016-07-13 DIAGNOSIS — S0590XA Unspecified injury of unspecified eye and orbit, initial encounter: Secondary | ICD-10-CM | POA: Insufficient documentation

## 2016-07-13 DIAGNOSIS — S0591XA Unspecified injury of right eye and orbit, initial encounter: Secondary | ICD-10-CM

## 2016-07-13 DIAGNOSIS — H1131 Conjunctival hemorrhage, right eye: Secondary | ICD-10-CM | POA: Insufficient documentation

## 2016-07-13 DIAGNOSIS — H21561 Pupillary abnormality, right eye: Secondary | ICD-10-CM | POA: Insufficient documentation

## 2016-07-13 DIAGNOSIS — H219 Unspecified disorder of iris and ciliary body: Secondary | ICD-10-CM

## 2016-07-13 DIAGNOSIS — H5461 Unqualified visual loss, right eye, normal vision left eye: Secondary | ICD-10-CM | POA: Insufficient documentation

## 2016-07-13 MED ORDER — HYDROCODONE-ACETAMINOPHEN 5-325 MG PO TABS
1.0000 | ORAL_TABLET | Freq: Once | ORAL | Status: AC
  Administered 2016-07-13: 1 via ORAL
  Filled 2016-07-13: qty 1

## 2016-07-13 NOTE — ED Triage Notes (Addendum)
Pt reports he fell at work on Saturday and  Hit his eye. Blood noted in the sclera and there appears to be a tear near his iris. Pt reports blurred vision.

## 2016-07-13 NOTE — ED Notes (Signed)
Unable to complete visual acuity at this time, pt unable to see charge with good or injured eye.  [facility] and sent to ED for further workup. 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 Pt only able to see shadows, painful movement.  Unequal pupils noted, sclera appears swollen and grossly red.

## 2016-07-13 NOTE — ED Provider Notes (Signed)
[facility]-EMER[facility]ENCY DEPT Provider Note   CSN: [id number] Arrival date & time: 07/13/16  1558     History   Chief Complaint Chief Complaint  Patient presents with  . Eye Injury    HPI Trevor Dixon is a 36 y.o. male.  Patient presents with complaint of right eye pain, swelling and bleeding since he fell 2 days ago onto a metal pole causing direct eye injury. He reports sharply decreased vision in the eye. There is some pain with movement but not significant. No other injury.    The history is provided by the patient. No language interpreter was used.  Eye Injury  Pertinent negatives include no chest pain, no abdominal pain, no headaches and no shortness of breath.    Past Medical History:  Diagnosis Date  . Fracture of fifth toe, left, open 03/17/2016    Patient Active Problem List   Diagnosis Date Noted  . Eye trauma 07/13/2016  . Traumatic hematoma of right orbit 07/13/2016  . Visual loss, right eye 07/13/2016  . Tear of iris stroma 07/13/2016    Past Surgical History:  Procedure Laterality Date  . I&D EXTREMITY Left 03/20/2016   Procedure: IRRIGATION AND DEBRIDEMENT LEFT FOOT;  Surgeon: Tarry Kos, MD;  Location: [facility];  Service: Orthopedics;  Laterality: Left;  IRRIGATION AND DEBRIDEMENT LEFT FOOT  . NO PAST SURGERIES         Home Medications    Prior to Admission medications   Medication Sig Start Date End Date Taking? Authorizing Provider  cephALEXin (KEFLEX) 500 MG capsule Take 1 capsule (500 mg total) by mouth 4 (four) times daily. Patient not taking: Reported on 07/13/2016 03/18/16   Janne Napoleon, NP  ciprofloxacin (CIPRO) 500 MG tablet Take 1 tablet (500 mg total) by mouth 2 (two) times daily. One po bid x 7 days Patient not taking: Reported on 07/13/2016 03/18/16   Fayrene Helper, PA-C  HYDROcodone-acetaminophen (NORCO) 7.5-325 MG tablet Take 1-2 tablets by mouth every 6 (six) hours as needed for moderate pain. Patient not taking:  Reported on 07/13/2016 03/20/16   Tarry Kos, MD  ondansetron (ZOFRAN) 4 MG tablet Take 1-2 tablets (4-8 mg total) by mouth every 8 (eight) hours as needed for nausea or vomiting. Patient not taking: Reported on 07/13/2016 03/20/16   Tarry Kos, MD  oxyCODONE-acetaminophen (PERCOCET/ROXICET) 5-325 MG tablet Take 1-2 tablets by mouth every 6 (six) hours as needed for moderate pain or severe pain. Patient not taking: Reported on 07/13/2016 03/18/16   Fayrene Helper, PA-C  senna-docusate (SENOKOT S) 8.6-50 MG tablet Take 1 tablet by mouth at bedtime as needed. Patient not taking: Reported on 07/13/2016 03/20/16   Tarry Kos, MD    Family History History reviewed. No pertinent family history.  Social History Social History  Substance Use Topics  . Smoking status: Never Smoker  . Smokeless tobacco: Never Used  . Alcohol use Yes     Comment: occasionally     Allergies   Patient has no known allergies.   Review of Systems Review of Systems  Constitutional: Negative for diaphoresis.  HENT: Negative for facial swelling.   Eyes: Positive for pain, redness and visual disturbance. Negative for photophobia, discharge and itching.  Respiratory: Negative for shortness of breath.   Cardiovascular: Negative for chest pain.  Gastrointestinal: Negative for abdominal pain.  Musculoskeletal: Negative for myalgias.  Neurological: Negative for headaches.     Physical Exam Updated Vital Signs BP 152/86 (BP Location: Right  Arm)   Pulse 83   Temp 98.8 F (37.1 C) (Oral)   Resp 16   SpO2 97%   Physical Exam  Constitutional: He is oriented to person, place, and time. He appears well-developed and well-nourished. No distress.  HENT:  Head: Normocephalic.  Nose: Nose normal.  Eyes: Lids are normal. Right conjunctiva has a hemorrhage. Left conjunctiva has no hemorrhage. Right eye exhibits normal extraocular motion. Right pupil is not reactive.  Right pupillary deformity. There is full hemorrhage of  the conjunctiva with bulging to lateral sclera adjacent to cornea. No hyphema appreciated. Positive red light reflex   Neck: Neck supple.  Pulmonary/Chest: No respiratory distress.  Neurological: He is alert and oriented to person, place, and time.     ED Treatments / Results  Labs (all labs ordered are listed, but only abnormal results are displayed) Labs Reviewed - No data to display  EKG  EKG Interpretation None       Radiology Ct Orbits Wo Contrast  Result Date: 07/13/2016 CLINICAL DATA:  36 year old male with trauma to the right eye. Evaluate for rupture of the globe. EXAM: CT ORBITS WITHOUT CONTRAST TECHNIQUE: Multidetector CT images were obtained using the standard protocol without intravenous contrast. COMPARISON:  None. FINDINGS: Evaluation of this exam is limited in the absence of intravenous contrast. Orbits: There is a very faint crescentic hypoattenuating area along the lateral aspect of the right globe (series 201 image 20). This is most likely artifactual. However, traumatic choroid injury is not entirely excluded. Correlation with upper multiple exam recommended. There is no globe rupture. The optic nerves, orbital fat, extraocular muscles, vascular structures, and lacrimal glands are normal. No radiodense foreign object identified. Visualized sinuses: Small bilateral maxillary sinus retention cysts or polyps. The visualized paranasal sinuses and mastoid air cells are otherwise clear. No air-fluid levels. Soft tissues: Negative. Limited intracranial: No significant or unexpected finding. IMPRESSION: No definite acute/traumatic injury to the globe. Faint crescentic hypodense area along the lateral aspect of the right globe is felt to be artifactual. Correlation with funduscopic exam recommended. Electronically Signed   By: Elgie CollardArash  Radparvar M.D.   On: 07/13/2016 23:31    Procedures Procedures (including critical care time)  Medications Ordered in ED Medications    HYDROcodone-acetaminophen (NORCO/VICODIN) 5-325 MG per tablet 1 tablet (1 tablet Oral Given 07/13/16 2128)     Initial Impression / Assessment and Plan / ED Course  I have reviewed the triage vital signs and the nursing notes.  Pertinent labs & imaging results that were available during my care of the patient were reviewed by me and considered in my medical decision making (see chart for details).     Patient presents with eye injury that occurred 2 days ago. He has a deformed pupil on right with large subconjunctival hemorrhage and scleral bulge. CT negative for globe rupture. Discussed with Dr. Genia DelMincey (optho) who will see the patient in the ED.  Patient evaluated by Dr. Genia DelMincey who confirms globe rupture injury and will take to OR.   Final Clinical Impressions(s) / ED Diagnoses   Final diagnoses:  Right eye injury, initial encounter    New Prescriptions New Prescriptions   No medications on file     Elpidio AnisShari Grayson Pfefferle, PA-C 07/14/16 0004    Maia PlanJoshua G Long, MD 07/14/16 1109

## 2016-07-13 NOTE — ED Notes (Signed)
Pt to CT via stretcher

## 2016-07-13 NOTE — Patient Instructions (Addendum)
  Must go to ER now.   IF you received an x-ray today, you will receive an invoice from [facility]. Please contact [facility] at [phone] with questions or concerns regarding your invoice.   IF you received labwork today, you will receive an invoice from [facility]. Please contact [facility] at [phone] with questions or concerns regarding your invoice.   Our billing staff will not be able to assist you with questions regarding bills from these companies.  You will be contacted with the lab results as soon as they are available. The fastest way to get your results is to activate your My Chart account. Instructions are located on the last page of this paperwork. If you have not heard from us regarding the results in 2 weeks, please contact this office.     
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
## 2016-07-14 ENCOUNTER — Emergency Department (HOSPITAL_COMMUNITY): Payer: Self-pay | Admitting: Anesthesiology

## 2016-07-14 ENCOUNTER — Encounter (HOSPITAL_COMMUNITY)
Admission: EM | Disposition: A | Discharge status: Home / Self Care | Payer: Self-pay | Source: Home / Self Care | Attending: Emergency Medicine

## 2016-07-14 DIAGNOSIS — S0531XA Ocular laceration without prolapse or loss of intraocular tissue, right eye, initial encounter: Secondary | ICD-10-CM | POA: Diagnosis present

## 2016-07-14 HISTORY — PX: RUPTURED GLOBE EXPLORATION AND REPAIR: SHX2366

## 2016-07-14 LAB — COMPREHENSIVE METABOLIC PANEL
ALBUMIN: 4.3 g/dL (ref 3.5–5.0)
ALK PHOS: 57 U/L (ref 38–126)
ALT: 70 U/L — AB (ref 17–63)
AST: 41 U/L (ref 15–41)
Anion gap: 11 (ref 5–15)
BUN: 6 mg/dL (ref 6–20)
CALCIUM: 9.6 mg/dL (ref 8.9–10.3)
CHLORIDE: 100 mmol/L — AB (ref 101–111)
CO2: 25 mmol/L (ref 22–32)
CREATININE: 0.83 mg/dL (ref 0.61–1.24)
GFR calc non Af Amer: >60 mL/min (ref >60)
GLUCOSE: 108 mg/dL — AB (ref 65–99)
Potassium: 3.8 mmol/L (ref 3.5–5.1)
SODIUM: 136 mmol/L (ref 135–145)
Total Bilirubin: 1.4 mg/dL — ABNORMAL HIGH (ref 0.3–1.2)
Total Protein: 7.7 g/dL (ref 6.5–8.1)

## 2016-07-14 LAB — CBC WITH DIFFERENTIAL/PLATELET
BASOS ABS: 0 10*3/uL (ref 0.0–0.1)
Basophils Relative: 0 %
EOS ABS: 0 10*3/uL (ref 0.0–0.7)
Eosinophils Relative: 0 %
HCT: 45.3 % (ref 39.0–52.0)
HEMOGLOBIN: 16 g/dL (ref 13.0–17.0)
LYMPHS ABS: 3.2 10*3/uL (ref 0.7–4.0)
Lymphocytes Relative: 33 %
MCH: 31 pg (ref 26.0–34.0)
MCHC: 35.3 g/dL (ref 30.0–36.0)
MCV: 87.8 fL (ref 78.0–100.0)
Monocytes Absolute: 0.6 10*3/uL (ref 0.1–1.0)
Monocytes Relative: 7 %
NEUTROS PCT: 60 %
Neutro Abs: 5.8 10*3/uL (ref 1.7–7.7)
Platelets: 332 10*3/uL (ref 150–400)
RBC: 5.16 MIL/uL (ref 4.22–5.81)
RDW: 13.1 % (ref 11.5–15.5)
WBC: 9.7 10*3/uL (ref 4.0–10.5)

## 2016-07-14 SURGERY — REPAIR, RUPTURE, GLOBE
Anesthesia: General | Site: Eye | Laterality: Right

## 2016-07-14 MED ORDER — HYALURONIDASE HUMAN 150 UNIT/ML IJ SOLN
INTRAMUSCULAR | Status: AC
  Filled 2016-07-14: qty 1

## 2016-07-14 MED ORDER — HYDROMORPHONE HCL 1 MG/ML IJ SOLN: 0.2500 mg | INTRAMUSCULAR | Status: DC | PRN

## 2016-07-14 MED ORDER — ROCURONIUM BROMIDE 50 MG/5ML IV SOSY
PREFILLED_SYRINGE | INTRAVENOUS | Status: AC
  Filled 2016-07-14: qty 5

## 2016-07-14 MED ORDER — CEFAZOLIN SODIUM-DEXTROSE 2-3 GM-% IV SOLR
INTRAVENOUS | Status: DC | PRN
Start: 2016-07-14 — End: 2016-07-14
  Administered 2016-07-14: 2 g via INTRAVENOUS

## 2016-07-14 MED ORDER — OXYCODONE HCL 5 MG PO TABS: 5.0000 mg | ORAL_TABLET | Freq: Once | ORAL | Status: DC | PRN

## 2016-07-14 MED ORDER — DEXAMETHASONE SODIUM PHOSPHATE 10 MG/ML IJ SOLN
INTRAMUSCULAR | Status: AC
  Filled 2016-07-14: qty 1

## 2016-07-14 MED ORDER — BUPIVACAINE HCL (PF) 0.75 % IJ SOLN
INTRAMUSCULAR | Status: DC | PRN
  Administered 2016-07-14: 4 mL

## 2016-07-14 MED ORDER — BACITRACIN-POLYMYXIN B 500-10000 UNIT/GM OP OINT
TOPICAL_OINTMENT | OPHTHALMIC | Status: AC
  Filled 2016-07-14: qty 3.5

## 2016-07-14 MED ORDER — ATROPINE SULFATE 1 % OP SOLN
OPHTHALMIC | Status: AC
  Filled 2016-07-14: qty 5

## 2016-07-14 MED ORDER — BACITRACIN-POLYMYXIN B 500-10000 UNIT/GM OP OINT
TOPICAL_OINTMENT | OPHTHALMIC | Status: DC | PRN
  Administered 2016-07-14: 1 via OPHTHALMIC

## 2016-07-14 MED ORDER — SODIUM HYALURONATE 10 MG/ML IO SOLN
INTRAOCULAR | Status: AC
  Filled 2016-07-14: qty 0.85

## 2016-07-14 MED ORDER — SODIUM HYALURONATE 10 MG/ML IO SOLN
INTRAOCULAR | Status: DC | PRN
  Administered 2016-07-14: 0.85 mL via INTRAOCULAR

## 2016-07-14 MED ORDER — PROPOFOL 10 MG/ML IV BOLUS
INTRAVENOUS | Status: DC | PRN
  Administered 2016-07-14: 180 mg via INTRAVENOUS

## 2016-07-14 MED ORDER — PROPOFOL 10 MG/ML IV BOLUS
INTRAVENOUS | Status: AC
  Filled 2016-07-14: qty 20

## 2016-07-14 MED ORDER — PROMETHAZINE HCL 25 MG/ML IJ SOLN: 6.2500 mg | INTRAMUSCULAR | Status: DC | PRN

## 2016-07-14 MED ORDER — LACTATED RINGERS IV SOLN
INTRAVENOUS | Status: DC | PRN
  Administered 2016-07-14 (×2): via INTRAVENOUS

## 2016-07-14 MED ORDER — LIDOCAINE HCL 2 % IJ SOLN
INTRAMUSCULAR | Status: AC
  Filled 2016-07-14: qty 20

## 2016-07-14 MED ORDER — SUFENTANIL CITRATE 50 MCG/ML IV SOLN
INTRAVENOUS | Status: DC | PRN
  Administered 2016-07-14 (×2): 10 ug via INTRAVENOUS

## 2016-07-14 MED ORDER — ATROPINE SULFATE 1 % OP SOLN
OPHTHALMIC | Status: DC | PRN
  Administered 2016-07-14: 1 [drp] via OPHTHALMIC

## 2016-07-14 MED ORDER — LIDOCAINE HCL (CARDIAC) 20 MG/ML IV SOLN
INTRAVENOUS | Status: DC | PRN
  Administered 2016-07-14: 60 mg via INTRATRACHEAL

## 2016-07-14 MED ORDER — SUFENTANIL CITRATE 50 MCG/ML IV SOLN
INTRAVENOUS | Status: AC
  Filled 2016-07-14: qty 1

## 2016-07-14 MED ORDER — BSS IO SOLN
INTRAOCULAR | Status: AC
  Filled 2016-07-14: qty 15

## 2016-07-14 MED ORDER — CEFAZOLIN SODIUM-DEXTROSE 2-4 GM/100ML-% IV SOLN
INTRAVENOUS | Status: AC
  Filled 2016-07-14: qty 100

## 2016-07-14 MED ORDER — BSS IO SOLN
INTRAOCULAR | Status: AC
  Filled 2016-07-14: qty 30

## 2016-07-14 MED ORDER — ONDANSETRON HCL 4 MG/2ML IJ SOLN
INTRAMUSCULAR | Status: DC | PRN
  Administered 2016-07-14: 4 mg via INTRAVENOUS

## 2016-07-14 MED ORDER — SUGAMMADEX SODIUM 200 MG/2ML IV SOLN
INTRAVENOUS | Status: AC
  Filled 2016-07-14: qty 2

## 2016-07-14 MED ORDER — SUCCINYLCHOLINE CHLORIDE 200 MG/10ML IV SOSY
PREFILLED_SYRINGE | INTRAVENOUS | Status: AC
  Filled 2016-07-14: qty 10

## 2016-07-14 MED ORDER — MIDAZOLAM HCL 2 MG/2ML IJ SOLN
INTRAMUSCULAR | Status: DC | PRN
  Administered 2016-07-14: 2 mg via INTRAVENOUS

## 2016-07-14 MED ORDER — ROCURONIUM BROMIDE 100 MG/10ML IV SOLN
INTRAVENOUS | Status: DC | PRN
  Administered 2016-07-14: 50 mg via INTRAVENOUS
  Administered 2016-07-14: 10 mg via INTRAVENOUS

## 2016-07-14 MED ORDER — ONDANSETRON HCL 4 MG/2ML IJ SOLN
INTRAMUSCULAR | Status: AC
  Filled 2016-07-14: qty 2

## 2016-07-14 MED ORDER — FLUORESCEIN SODIUM 0.6 MG OP STRP
ORAL_STRIP | OPHTHALMIC | Status: AC
  Filled 2016-07-14: qty 1

## 2016-07-14 MED ORDER — CEFTAZIDIME 1 G IJ SOLR
INTRAMUSCULAR | Status: AC
  Filled 2016-07-14: qty 1

## 2016-07-14 MED ORDER — LIDOCAINE HCL 2 % IJ SOLN
INTRAMUSCULAR | Status: DC | PRN
  Administered 2016-07-14: 1 mL

## 2016-07-14 MED ORDER — SODIUM CHLORIDE 0.9 % IJ SOLN
INTRAMUSCULAR | Status: AC
  Filled 2016-07-14: qty 10

## 2016-07-14 MED ORDER — SUGAMMADEX SODIUM 200 MG/2ML IV SOLN
INTRAVENOUS | Status: DC | PRN
  Administered 2016-07-14: 200 mg via INTRAVENOUS

## 2016-07-14 MED ORDER — OXYCODONE HCL 5 MG/5ML PO SOLN: 5.0000 mg | Freq: Once | ORAL | Status: DC | PRN

## 2016-07-14 MED ORDER — MIDAZOLAM HCL 2 MG/2ML IJ SOLN
INTRAMUSCULAR | Status: AC
  Filled 2016-07-14: qty 2

## 2016-07-14 MED ORDER — TRIAMCINOLONE ACETONIDE 40 MG/ML IJ SUSP
INTRAMUSCULAR | Status: AC
Start: 2016-07-14 — End: 2016-07-14
  Filled 2016-07-14: qty 5

## 2016-07-14 MED ORDER — LIDOCAINE 2% (20 MG/ML) 5 ML SYRINGE
INTRAMUSCULAR | Status: AC
  Filled 2016-07-14: qty 5

## 2016-07-14 MED ORDER — BSS IO SOLN
INTRAOCULAR | Status: DC | PRN
  Administered 2016-07-14: 45 mL via INTRAOCULAR

## 2016-07-14 MED ORDER — BUPIVACAINE HCL (PF) 0.75 % IJ SOLN
INTRAMUSCULAR | Status: AC
  Filled 2016-07-14: qty 10

## 2016-07-14 SURGICAL SUPPLY — 74 items
ACCESSORY FRAGMATOME (MISCELLANEOUS) IMPLANT
APPLICATOR DR MATTHEWS STRL (MISCELLANEOUS) ×3 IMPLANT
BAG URINE DRAINAGE (UROLOGICAL SUPPLIES) IMPLANT
BLADE 10 SAFETY STRL DISP (BLADE) ×3 IMPLANT
BLADE EYE CATARACT 19 1.4 BEAV (BLADE) IMPLANT
BLADE MVR KNIFE 19G (BLADE) IMPLANT
BLADE SURG 15 STRL LF DISP TIS (BLADE) IMPLANT
BLADE SURG 15 STRL SS (BLADE)
CANNULA ANT CHAM MAIN (OPHTHALMIC RELATED) IMPLANT
CANNULA SUBRETINAL FLUID 20G (BLADE) IMPLANT
CATH FOLEY 2WAY SLVR  5CC 16FR (CATHETERS)
CATH FOLEY 2WAY SLVR 5CC 16FR (CATHETERS) IMPLANT
CORDS BIPOLAR (ELECTRODE) ×3 IMPLANT
COTTONBALL LRG STERILE PKG (GAUZE/BANDAGES/DRESSINGS) IMPLANT
COVER MAYO STAND STRL (DRAPES) IMPLANT
COVER SURGICAL LIGHT HANDLE (MISCELLANEOUS) ×3 IMPLANT
DRAPE OPHTHALMIC 77X100 STRL (CUSTOM PROCEDURE TRAY) ×3 IMPLANT
ERASER HMR WETFIELD 23G BP (MISCELLANEOUS) ×3 IMPLANT
FILTER BLUE MILLIPORE (MISCELLANEOUS) IMPLANT
GAS OPHTHALMIC (MISCELLANEOUS) IMPLANT
GLOVE BIOGEL PI IND STRL 7.5 (GLOVE) ×2 IMPLANT
GLOVE BIOGEL PI INDICATOR 7.5 (GLOVE) ×4
GLOVE INDICATOR 7.5 STRL GRN (GLOVE) ×3 IMPLANT
GLOVE SS BIOGEL STRL SZ 6.5 (GLOVE) ×1 IMPLANT
GLOVE SS BIOGEL STRL SZ 7.5 (GLOVE) ×1 IMPLANT
GLOVE SUPERSENSE BIOGEL SZ 6.5 (GLOVE) ×2
GLOVE SUPERSENSE BIOGEL SZ 7.5 (GLOVE) ×2
GLOVE SURG 8.5 LATEX PF (GLOVE) IMPLANT
GOWN STRL REUS W/ TWL LRG LVL3 (GOWN DISPOSABLE) ×2 IMPLANT
GOWN STRL REUS W/TWL LRG LVL3 (GOWN DISPOSABLE) ×4
KIT BASIN OR (CUSTOM PROCEDURE TRAY) ×3 IMPLANT
KIT PERFLUORON PROCEDURE 5ML (MISCELLANEOUS) IMPLANT
KIT ROOM TURNOVER OR (KITS) ×3 IMPLANT
KNIFE CRESCENT 1.75 EDGEAHEAD (BLADE) IMPLANT
KNIFE GRIESHABER SHARP 2.5MM (MISCELLANEOUS) IMPLANT
MARKER SKIN DUAL TIP RULER LAB (MISCELLANEOUS) IMPLANT
NEEDLE 18GX1X1/2 (RX/OR ONLY) (NEEDLE) ×3 IMPLANT
NEEDLE 25GX 5/8IN NON SAFETY (NEEDLE) ×3 IMPLANT
NEEDLE HYPO 30X.5 LL (NEEDLE) ×6 IMPLANT
NEEDLE PRECISIONGLIDE 27X1.5 (NEEDLE) IMPLANT
NS IRRIG 1000ML POUR BTL (IV SOLUTION) ×3 IMPLANT
PACK VITRECTOMY CUSTOM (CUSTOM PROCEDURE TRAY) ×3 IMPLANT
PACK VITRECTOMY PIC MCHSVP (PACKS) ×3 IMPLANT
PAD ARMBOARD 7.5X6 YLW CONV (MISCELLANEOUS) ×6 IMPLANT
PROBE LASER 20G CVD ENDOCULAR (MISCELLANEOUS) ×3 IMPLANT
PROBE LASER LIGHT 20GA (MISCELLANEOUS) IMPLANT
REPL STRA BRUSH NEEDLE (NEEDLE) IMPLANT
RESERVOIR BACK FLUSH (MISCELLANEOUS) IMPLANT
ROLLS DENTAL (MISCELLANEOUS) IMPLANT
SCRAPER DIAMOND DUST MEMBRANE (MISCELLANEOUS) IMPLANT
SET VGFI TUBING 8065808002 (SET/KITS/TRAYS/PACK) IMPLANT
SPEAR EYE SURG WECK-CEL (MISCELLANEOUS) IMPLANT
STOPCOCK 4 WAY LG BORE MALE ST (IV SETS) IMPLANT
SUT CHROMIC 7 0 TG140 8 (SUTURE) IMPLANT
SUT ETHILON 10 0 BV100 4 (SUTURE) IMPLANT
SUT ETHILON 10 0 CS140 6 (SUTURE) ×3 IMPLANT
SUT ETHILON 8 0 TG100 8 (SUTURE) ×6 IMPLANT
SUT ETHILON 9 0 TG140 8 (SUTURE) ×3 IMPLANT
SUT SILK 2 0 (SUTURE)
SUT SILK 2-0 18XBRD TIE 12 (SUTURE) IMPLANT
SUT VICRYL 7 0 TG140 8 (SUTURE) IMPLANT
SWAB COLLECTION DEVICE MRSA (MISCELLANEOUS) IMPLANT
SYR 20CC LL (SYRINGE) ×3 IMPLANT
SYR 50ML SLIP (SYRINGE) IMPLANT
SYR 5ML LL (SYRINGE) IMPLANT
SYR BULB 3OZ (MISCELLANEOUS) ×3 IMPLANT
SYR TB 1ML LUER SLIP (SYRINGE) ×3 IMPLANT
SYRINGE 10CC LL (SYRINGE) IMPLANT
TOWEL OR 17X24 6PK STRL BLUE (TOWEL DISPOSABLE) ×9 IMPLANT
TUBE ANAEROBIC SPECIMEN COL (MISCELLANEOUS) IMPLANT
TUBING HIGH PRESS EXTEN 6IN (TUBING) IMPLANT
VITREORETINAL VISCODISSEC (MISCELLANEOUS) IMPLANT
WATER STERILE IRR 1000ML POUR (IV SOLUTION) ×3 IMPLANT
WIPE INSTRUMENT VISIWIPE 73X73 (MISCELLANEOUS) ×3 IMPLANT

## 2016-07-14 NOTE — ED Notes (Signed)
Pt transported to OR by this RN. Report given to OR RN

## 2016-07-14 NOTE — Transfer of Care (Signed)
Immediate Anesthesia Transfer of Care Note  Patient: Trevor GlennFrancisco Hursey  Procedure(s) Performed: Procedure(s): REPAIR OF RUPTURED GLOBE (Right)  Patient Location: PACU  Anesthesia Type:General  Level of Consciousness: awake, alert  and oriented  Airway & Oxygen Therapy: Patient Spontanous Breathing and Patient connected to face mask oxygen  Post-op Assessment: Report given to RN and Post -op Vital signs reviewed and stable  Post vital signs: Reviewed and stable  Last Vitals:  Vitals:   07/14/16 0045 07/14/16 0315  BP: 163/96 (P) 138/73  Pulse:  (P) 92  Resp:  (P) 16  Temp:  (P) 36.9 C    Last Pain:  Vitals:   07/14/16 0044  TempSrc: Oral  PainSc:          Complications: No apparent anesthesia complications

## 2016-07-14 NOTE — Discharge Instructions (Signed)
Follow up with  Dr. Genia DelMincey on 07/15/2016 call  [phone],  office [place]; [phone], [phone] Office address;  [address] [place] [address]  Leave eye shield until see MD. No other prescription medicine.    Post Anesthesia Home Care Instructions  Activity: Get plenty of rest for the remainder of the day. A responsible adult should stay with you for 24 hours following the procedure.  For the next 24 hours, DO NOT: -Drive a car -Advertising copywriterperate machinery -Drink alcoholic beverages -Take any medication unless instructed by your physician -Make any legal decisions or sign important papers.  Meals: Start with liquid foods such as gelatin or soup. Progress to regular foods as tolerated. Avoid greasy, spicy, heavy foods. If nausea and/or vomiting occur, drink only clear liquids until the nausea and/or vomiting subsides. Call your physician if vomiting continues.  Special Instructions/Symptoms: Your throat may feel dry or sore from the anesthesia or the breathing tube placed in your throat during surgery. If this causes discomfort, gargle with warm salt water. The discomfort should disappear within 24 hours.  If you had a scopolamine patch placed behind your ear for the management of post- operative nausea and/or vomiting:  1. The medication in the patch is effective for 72 hours, after which it should be removed.  Wrap patch in a tissue and discard in the trash. Wash hands thoroughly with soap and water. 2. You may remove the patch earlier than 72 hours if you experience unpleasant side effects which may include dry mouth, dizziness or visual disturbances. 3. Avoid touching the patch. Wash your hands with soap and water after contact with the patch.

## 2016-07-14 NOTE — Anesthesia Procedure Notes (Signed)
Procedure Name: Intubation Date/Time: 07/14/2016 1:37 AM Performed by: Israa Caban S Pre-anesthesia Checklist: Patient identified, Emergency Drugs available, Suction available, Patient being monitored and Timeout performed Patient Re-evaluated:Patient Re-evaluated prior to inductionOxygen Delivery Method: Circle system utilized Preoxygenation: Pre-oxygenation with 100% oxygen Intubation Type: IV induction Ventilation: Mask ventilation without difficulty Laryngoscope Size: Mac and 4 Grade View: Grade I Tube size: 8.0 mm Number of attempts: 1 Airway Equipment and Method: Stylet Placement Confirmation: ETT inserted through vocal cords under direct vision,  positive ETCO2 and breath sounds checked- equal and bilateral Secured at: 22 cm Tube secured with: Tape Dental Injury: Teeth and Oropharynx as per pre-operative assessment

## 2016-07-14 NOTE — Anesthesia Preprocedure Evaluation (Signed)
Anesthesia Evaluation  Patient identified by MRN, date of birth, ID band Patient awake    Reviewed: Allergy & Precautions, NPO status , Patient's Chart, lab work & pertinent test results  Airway Mallampati: II  TM Distance: >3 FB Neck ROM: Full    Dental no notable dental hx.    Pulmonary neg pulmonary ROS,    Pulmonary exam normal breath sounds clear to auscultation       Cardiovascular negative cardio ROS Normal cardiovascular exam Rhythm:Regular Rate:Normal  Hypertensive in ED   Neuro/Psych negative neurological ROS  negative psych ROS   GI/Hepatic negative GI ROS, Neg liver ROS,   Endo/Other  negative endocrine ROS  Renal/GU negative Renal ROS  negative genitourinary   Musculoskeletal negative musculoskeletal ROS (+)   Abdominal   Peds negative pediatric ROS (+)  Hematology negative hematology ROS (+)   Anesthesia Other Findings   Reproductive/Obstetrics negative OB ROS                             Anesthesia Physical Anesthesia Plan  ASA: II and emergent  Anesthesia Plan: General   Post-op Pain Management:    Induction: Intravenous  Airway Management Planned: Oral ETT  Additional Equipment:   Intra-op Plan:   Post-operative Plan: Extubation in OR  Informed Consent: I have reviewed the patients History and Physical, chart, labs and discussed the procedure including the risks, benefits and alternatives for the proposed anesthesia with the patient or authorized representative who has indicated his/her understanding and acceptance.   Dental advisory given  Plan Discussed with: CRNA and Surgeon  Anesthesia Plan Comments:         Anesthesia Quick Evaluation

## 2016-07-14 NOTE — Op Note (Signed)
OPHTHALMOLOGY OPERATIVE NOTE  SURGEON: Mack HookAndrew Ona Rathert, M.D.   OPERATIVE EYE: Right  PRE OP DIAGNOSIS: Ruptured globe, Right eye  POST OP DIAGNOSIS: SAME  PROCEDURE: Repair of ruptured globe, Right eye  ANESTHESIA: General anesthesia  COMPLICATIONS:  NONE  DESCRIPTION OF PROCEDURE:  The patient was seen in the preoperative area where the surgical eye was marked and received topical anesthetic and 5% betadine. The patient was brought back to the operating room. The eye was prepped and draped in the usual sterile fashion for surgery. The Surgeon sat superiorly. A lid speculum was placed. The eye was inspected and found to have 360 degrees subconjunctival hemorrhage and a peaked pupil superiorly. A paracentisis was made and viscoat instilled into the eye gently. The chamber was partially reformed.  A suspected subconjunctival limbal rupture was noted at 12 o'clock. The conjunctiva was incised.  Expulsed tissue was inserted into the eye. A 8-0 nylon was used to close the limbal rupture in an interrupted fashion.    When the wound was found to be tight, the eye was pressurized and found to be water tight and sidel negative. Fluid was expressed from the eye until a pressure of <21 was reached by tactile tensions. The conjunctiva was closed with 10-0 nylon.  A subtenon injection of kenalog was given. The Procedure was terminated. Retrobulbar block of lidocaine with bupivicaine was given. Subtenons kenalog was given.  An eye patch was placed with steroid antibiotic ointment and atropine drops. The patient was taken to recovery in stable condition.

## 2016-07-14 NOTE — ED Notes (Signed)
OR called and are ready for pt.  

## 2016-07-14 NOTE — Progress Notes (Signed)
DC home early in the morning Follow up with Dr. Genia DelMincey on 07/15/2016 Phone # [phone] VO. Per Dr. Genia DelMincey

## 2016-07-15 ENCOUNTER — Encounter (HOSPITAL_COMMUNITY): Payer: Self-pay | Admitting: Ophthalmology

## 2016-07-16 NOTE — Addendum Note (Signed)
Addendum  created 07/16/16 1653 by Eilene GhaziGeorge Aftyn Nott, MD   Anesthesia Staff edited

## 2016-07-16 NOTE — Anesthesia Postprocedure Evaluation (Addendum)
Anesthesia Post Note  Patient: Trevor Dixon  Procedure(s) Performed: Procedure(s) (LRB): REPAIR OF RUPTURED GLOBE (Right)  Patient location during evaluation: PACU Anesthesia Type: General Level of consciousness: awake and alert Pain management: pain level controlled Vital Signs Assessment: post-procedure vital signs reviewed and stable Respiratory status: spontaneous breathing, nonlabored ventilation, respiratory function stable and patient connected to nasal cannula oxygen Cardiovascular status: blood pressure returned to baseline and stable Postop Assessment: no signs of nausea or vomiting Anesthetic complications: no       Last Vitals:  Vitals:   07/14/16 0500 07/14/16 0523  BP: 127/75   Pulse: 67 74  Resp: 13 15  Temp:  36.7 C    Last Pain:  Vitals:   07/14/16 0044  TempSrc: Oral  PainSc:                  Kingstyn Deruiter S

## 2016-11-23 NOTE — Addendum Note (Signed)
Addendum  created 11/23/16 1018 by Eryca Bolte, MD   Sign clinical note    

## 2017-02-21 ENCOUNTER — Emergency Department (HOSPITAL_COMMUNITY): Payer: Self-pay

## 2017-02-21 ENCOUNTER — Encounter (HOSPITAL_COMMUNITY): Payer: Self-pay | Admitting: *Deleted

## 2017-02-21 ENCOUNTER — Encounter (HOSPITAL_COMMUNITY): Payer: Self-pay | Admitting: Ophthalmology

## 2017-02-21 ENCOUNTER — Emergency Department (HOSPITAL_COMMUNITY)
Admission: EM | Admit: 2017-02-21 | Discharge: 2017-02-21 | Disposition: A | Discharge status: Home / Self Care | Payer: Self-pay | Attending: Emergency Medicine | Admitting: Emergency Medicine

## 2017-02-21 DIAGNOSIS — S161XXA Strain of muscle, fascia and tendon at neck level, initial encounter: Secondary | ICD-10-CM | POA: Insufficient documentation

## 2017-02-21 DIAGNOSIS — Y939 Activity, unspecified: Secondary | ICD-10-CM | POA: Insufficient documentation

## 2017-02-21 DIAGNOSIS — S01112A Laceration without foreign body of left eyelid and periocular area, initial encounter: Secondary | ICD-10-CM | POA: Insufficient documentation

## 2017-02-21 DIAGNOSIS — Y999 Unspecified external cause status: Secondary | ICD-10-CM | POA: Insufficient documentation

## 2017-02-21 DIAGNOSIS — Y929 Unspecified place or not applicable: Secondary | ICD-10-CM | POA: Insufficient documentation

## 2017-02-21 DIAGNOSIS — S0083XA Contusion of other part of head, initial encounter: Secondary | ICD-10-CM | POA: Insufficient documentation

## 2017-02-21 DIAGNOSIS — F17200 Nicotine dependence, unspecified, uncomplicated: Secondary | ICD-10-CM | POA: Insufficient documentation

## 2017-02-21 DIAGNOSIS — S0990XA Unspecified injury of head, initial encounter: Secondary | ICD-10-CM | POA: Insufficient documentation

## 2017-02-21 MED ORDER — TRAMADOL HCL 50 MG PO TABS: 50.0000 mg | ORAL_TABLET | Freq: Four times a day (QID) | ORAL | 0 refills | Status: DC | PRN

## 2017-02-21 MED ORDER — LIDOCAINE HCL (PF) 1 % IJ SOLN
5.0000 mL | Freq: Once | INTRAMUSCULAR | Status: DC
  Filled 2017-02-21: qty 5
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
## 2017-02-21 NOTE — Discharge Instructions (Signed)
Local wound care with bacitracin twice daily.  Sutures are to be removed in the next 5-7 days. Please follow-up with your primary Dr. for this.  Tramadol as prescribed as needed for pain.  Return to the emergency department if you experience any new or concerning symptoms.

## 2017-02-21 NOTE — ED Notes (Signed)
Wounds to face cleaned with  Soap and water

## 2017-02-21 NOTE — ED Notes (Signed)
The pt has been sleeping we cannot get anyone to answer the phone at  Numbers he has tried.  He cannot remember where he lives. Not alert enough  To use his phone  Swelling and bruising lt eye lids around the suture line

## 2017-02-21 NOTE — ED Notes (Signed)
Pt to ct 
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
## 2017-02-21 NOTE — ED Notes (Signed)
The pt appaers to speak only spanish but he understand english.  On arrival initially he kept both his eyes closed but he had rapid eye movements under his eyelids.  When the bandage was removed from his wound he opened his eyes police here at bedside including csi taking pictures

## 2017-02-25 ENCOUNTER — Emergency Department (HOSPITAL_COMMUNITY)
Admission: EM | Admit: 2017-02-25 | Discharge: 2017-02-25 | Disposition: A | Discharge status: Home / Self Care | Payer: Self-pay | Attending: Emergency Medicine | Admitting: Emergency Medicine

## 2017-02-25 ENCOUNTER — Encounter (HOSPITAL_COMMUNITY): Payer: Self-pay | Admitting: *Deleted

## 2017-02-25 DIAGNOSIS — Z4802 Encounter for removal of sutures: Secondary | ICD-10-CM | POA: Insufficient documentation

## 2017-02-25 DIAGNOSIS — F17200 Nicotine dependence, unspecified, uncomplicated: Secondary | ICD-10-CM | POA: Insufficient documentation

## 2017-02-25 NOTE — ED Triage Notes (Signed)
Pt reports having sutures place 5 days ago. No complications reported

## 2017-02-25 NOTE — Discharge Instructions (Signed)
Please read attached information. If you experience any new or worsening signs or symptoms please return to the emergency room for evaluation. Please follow-up with your primary care provider or specialist as discussed.  °
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
## 2021-01-02 ENCOUNTER — Other Ambulatory Visit: Payer: Self-pay

## 2021-01-02 ENCOUNTER — Inpatient Hospital Stay (HOSPITAL_COMMUNITY)
Admission: EM | Admit: 2021-01-02 | Discharge: 2021-01-14 | DRG: 853 | Disposition: A | Discharge status: Home / Self Care | Payer: Self-pay | Attending: Family Medicine | Admitting: Family Medicine

## 2021-01-02 ENCOUNTER — Emergency Department (HOSPITAL_COMMUNITY): Payer: Self-pay

## 2021-01-02 ENCOUNTER — Encounter (HOSPITAL_COMMUNITY): Payer: Self-pay

## 2021-01-02 DIAGNOSIS — Z789 Other specified health status: Secondary | ICD-10-CM

## 2021-01-02 DIAGNOSIS — R7401 Elevation of levels of liver transaminase levels: Secondary | ICD-10-CM

## 2021-01-02 DIAGNOSIS — R911 Solitary pulmonary nodule: Secondary | ICD-10-CM | POA: Diagnosis present

## 2021-01-02 DIAGNOSIS — K5900 Constipation, unspecified: Secondary | ICD-10-CM | POA: Diagnosis present

## 2021-01-02 DIAGNOSIS — R042 Hemoptysis: Secondary | ICD-10-CM

## 2021-01-02 DIAGNOSIS — F17210 Nicotine dependence, cigarettes, uncomplicated: Secondary | ICD-10-CM | POA: Diagnosis present

## 2021-01-02 DIAGNOSIS — Z7289 Other problems related to lifestyle: Secondary | ICD-10-CM

## 2021-01-02 DIAGNOSIS — K6289 Other specified diseases of anus and rectum: Secondary | ICD-10-CM

## 2021-01-02 DIAGNOSIS — Z79899 Other long term (current) drug therapy: Secondary | ICD-10-CM

## 2021-01-02 DIAGNOSIS — R0902 Hypoxemia: Secondary | ICD-10-CM

## 2021-01-02 DIAGNOSIS — J9601 Acute respiratory failure with hypoxia: Secondary | ICD-10-CM

## 2021-01-02 DIAGNOSIS — K760 Fatty (change of) liver, not elsewhere classified: Secondary | ICD-10-CM | POA: Diagnosis present

## 2021-01-02 DIAGNOSIS — J189 Pneumonia, unspecified organism: Secondary | ICD-10-CM

## 2021-01-02 DIAGNOSIS — R7989 Other specified abnormal findings of blood chemistry: Secondary | ICD-10-CM

## 2021-01-02 DIAGNOSIS — Z20822 Contact with and (suspected) exposure to covid-19: Secondary | ICD-10-CM | POA: Diagnosis present

## 2021-01-02 DIAGNOSIS — F1090 Alcohol use, unspecified, uncomplicated: Secondary | ICD-10-CM

## 2021-01-02 DIAGNOSIS — A419 Sepsis, unspecified organism: Principal | ICD-10-CM | POA: Diagnosis present

## 2021-01-02 DIAGNOSIS — K612 Anorectal abscess: Secondary | ICD-10-CM | POA: Diagnosis present

## 2021-01-02 DIAGNOSIS — S0540XA Penetrating wound of orbit with or without foreign body, unspecified eye, initial encounter: Secondary | ICD-10-CM

## 2021-01-02 LAB — URINALYSIS, ROUTINE W REFLEX MICROSCOPIC
Bilirubin Urine: NEGATIVE
Glucose, UA: NEGATIVE mg/dL
Ketones, ur: NEGATIVE mg/dL
Leukocytes,Ua: NEGATIVE
Nitrite: NEGATIVE
Protein, ur: 30 mg/dL — AB
Specific Gravity, Urine: 1.025 (ref 1.005–1.030)
pH: 6 (ref 5.0–8.0)

## 2021-01-02 LAB — URINALYSIS, MICROSCOPIC (REFLEX): Bacteria, UA: NONE SEEN

## 2021-01-02 LAB — COMPREHENSIVE METABOLIC PANEL
ALT: 59 U/L — ABNORMAL HIGH (ref 0–44)
AST: 135 U/L — ABNORMAL HIGH (ref 15–41)
Albumin: 3.5 g/dL (ref 3.5–5.0)
Alkaline Phosphatase: 131 U/L — ABNORMAL HIGH (ref 38–126)
Anion gap: 10 (ref 5–15)
BUN: <5 mg/dL — ABNORMAL LOW (ref 6–20)
CO2: 23 mmol/L (ref 22–32)
Calcium: 9.1 mg/dL (ref 8.9–10.3)
Chloride: 100 mmol/L (ref 98–111)
Creatinine, Ser: 0.78 mg/dL (ref 0.61–1.24)
GFR, Estimated: >60 mL/min (ref >60)
Glucose, Bld: 114 mg/dL — ABNORMAL HIGH (ref 70–99)
Potassium: 4.3 mmol/L (ref 3.5–5.1)
Sodium: 133 mmol/L — ABNORMAL LOW (ref 135–145)
Total Bilirubin: 1.1 mg/dL (ref 0.3–1.2)
Total Protein: 8.6 g/dL — ABNORMAL HIGH (ref 6.5–8.1)

## 2021-01-02 LAB — RESP PANEL BY RT-PCR (FLU A&B, COVID) ARPGX2
Influenza A by PCR: NEGATIVE
Influenza B by PCR: NEGATIVE
SARS Coronavirus 2 by RT PCR: NEGATIVE

## 2021-01-02 LAB — CBC
HCT: 42.9 % (ref 39.0–52.0)
Hemoglobin: 14.7 g/dL (ref 13.0–17.0)
MCH: 31.7 pg (ref 26.0–34.0)
MCHC: 34.3 g/dL (ref 30.0–36.0)
MCV: 92.7 fL (ref 80.0–100.0)
Platelets: 296 10*3/uL (ref 150–400)
RBC: 4.63 MIL/uL (ref 4.22–5.81)
RDW: 12.7 % (ref 11.5–15.5)
WBC: 18.5 10*3/uL — ABNORMAL HIGH (ref 4.0–10.5)
nRBC: 0 % (ref 0.0–0.2)

## 2021-01-02 LAB — LIPASE, BLOOD: Lipase: 35 U/L (ref 11–51)

## 2021-01-02 LAB — POC OCCULT BLOOD, ED: Fecal Occult Bld: NEGATIVE

## 2021-01-02 MED ORDER — BISACODYL 5 MG PO TBEC
5.0000 mg | DELAYED_RELEASE_TABLET | Freq: Every day | ORAL | Status: DC | PRN
  Administered 2021-01-05: 5 mg via ORAL
  Filled 2021-01-02: qty 1

## 2021-01-02 MED ORDER — MORPHINE SULFATE (PF) 2 MG/ML IV SOLN: 1.0000 mg | Freq: Once | INTRAVENOUS | Status: DC

## 2021-01-02 MED ORDER — LACTATED RINGERS IV BOLUS
1000.0000 mL | Freq: Once | INTRAVENOUS | Status: AC
  Administered 2021-01-02: 1000 mL via INTRAVENOUS

## 2021-01-02 MED ORDER — ACETAMINOPHEN 325 MG PO TABS
650.0000 mg | ORAL_TABLET | Freq: Once | ORAL | Status: AC | PRN
  Administered 2021-01-02: 650 mg via ORAL
  Filled 2021-01-02: qty 2

## 2021-01-02 MED ORDER — METRONIDAZOLE 500 MG/100ML IV SOLN
500.0000 mg | Freq: Three times a day (TID) | INTRAVENOUS | Status: DC
  Administered 2021-01-03 – 2021-01-07 (×12): 500 mg via INTRAVENOUS
  Filled 2021-01-02 (×13): qty 100

## 2021-01-02 MED ORDER — CEFTRIAXONE SODIUM 2 G IJ SOLR
2.0000 g | INTRAMUSCULAR | Status: DC
  Administered 2021-01-03 – 2021-01-06 (×5): 2 g via INTRAVENOUS
  Filled 2021-01-02 (×3): qty 2
  Filled 2021-01-02 (×2): qty 20

## 2021-01-02 MED ORDER — KETOROLAC TROMETHAMINE 15 MG/ML IJ SOLN: 15.0000 mg | Freq: Once | INTRAMUSCULAR | Status: DC

## 2021-01-02 MED ORDER — KETOROLAC TROMETHAMINE 15 MG/ML IJ SOLN
15.0000 mg | Freq: Once | INTRAMUSCULAR | Status: AC
  Administered 2021-01-02: 15 mg via INTRAVENOUS
  Filled 2021-01-02: qty 1

## 2021-01-02 MED ORDER — IOHEXOL 300 MG/ML  SOLN
100.0000 mL | Freq: Once | INTRAMUSCULAR | Status: AC | PRN
  Administered 2021-01-02: 100 mL via INTRAVENOUS

## 2021-01-02 MED ORDER — PIPERACILLIN-TAZOBACTAM 3.375 G IVPB 30 MIN
3.3750 g | Freq: Once | INTRAVENOUS | Status: AC
  Administered 2021-01-02: 3.375 g via INTRAVENOUS
  Filled 2021-01-02: qty 50

## 2021-01-02 MED ORDER — MORPHINE SULFATE (PF) 2 MG/ML IV SOLN
1.0000 mg | INTRAVENOUS | Status: DC | PRN
  Administered 2021-01-03 (×2): 1 mg via INTRAVENOUS
  Filled 2021-01-02 (×2): qty 1

## 2021-01-02 NOTE — ED Triage Notes (Signed)
Spanish interpreter used for triage. Pt reports constipation and rectal pain since last week. Last BM 3 days ago.  Pain when laying flat.denies blood in stool. Denies trying otc medications. Denies urinary problems. Pt febrile triage. Pt a/o.

## 2021-01-02 NOTE — ED Provider Notes (Signed)
[facility] EMERGENCY DEPARTMENT Provider Note   CSN: [id number] Arrival date & time: 01/02/21  1050     History Chief Complaint  Patient presents with   Constipation    Trevor Dixon is a 40 y.o. male.   Constipation  40 year old male no relevant PMHx, presenting for constipation and rectal pain x1 week.  Pain is located in rectum, nonradiating, aching quality, gradual onset last week, worsened when laying flat and did not have BM, no improving factors.  Associate symptoms include headaches when he strains too hard.  He states that he has been having small bowel movements, most recently today.  Still passing flatus.  Has not tried any OTC medications yet.  Drinks ~3 24 ounce beers per day.  No further medical concerns at this time including chills, sweats, cough, SOB, CP, palpitations, abdominal pain, N/V, diarrhea, melena, hematochezia, dysuria, hematuria, syncope, seizure, focal paresthesia/weakness.  History obtained from patient and chart review.     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  Social History    Tobacco Use   Smoking status: Every Day   Smokeless tobacco: Never  Substance Use Topics   Alcohol use: Yes    Comment: occasionally   Drug use: No    Home Medications Prior to Admission medications   Medication Sig Start Date End Date Taking? Authorizing Provider  HYDROcodone-acetaminophen (NORCO) 7.5-325 MG tablet Take 1-2 tablets by mouth every 6 (six) hours as needed for moderate pain. Patient not taking: No sig reported 03/20/16   Tarry Kos, MD  ondansetron (ZOFRAN) 4 MG tablet Take 1-2 tablets (4-8 mg total) by mouth every 8 (eight) hours as needed for nausea or vomiting. Patient not taking: No sig reported 03/20/16   Tarry Kos, MD  oxyCODONE-acetaminophen (PERCOCET/ROXICET) 5-325 MG tablet Take 1-2 tablets by mouth every 6 (six) hours as needed for moderate pain or severe pain. Patient not taking: No sig reported 03/18/16   Fayrene Helper, PA-C  senna-docusate (SENOKOT S) 8.6-50 MG tablet Take 1 tablet by mouth at bedtime as needed. Patient not taking: No sig reported 03/20/16   Tarry Kos, MD  traMADol (ULTRAM) 50 MG tablet Take 1 tablet (50 mg total) by mouth every 6 (six) hours as needed. Patient not taking: No sig reported 02/21/17   Geoffery Lyons, MD    Allergies    Patient has no known allergies.  Review of Systems   Review of Systems  Gastrointestinal:  Positive for constipation.  All other systems reviewed and are negative.  Physical Exam Updated Vital Signs  BP 123/66 (BP Location: Left Arm)   Pulse (!) 101   Temp 100.2 F (37.9 C) (Oral)   Resp 18   Ht 5\' 6"  (1.676 m)   Wt 68 kg   SpO2 98%   BMI 24.21 kg/m   Physical Exam Vitals and nursing note reviewed. Exam conducted with a chaperone present.  Constitutional:      General: He is not in acute distress.    Appearance: Normal appearance. He is not toxic-appearing.  HENT:     Head: Normocephalic and atraumatic.  Eyes:     Extraocular Movements: Extraocular movements intact.      Conjunctiva/sclera: Conjunctivae normal.     Pupils: Pupils are equal, round, and reactive to light.  Cardiovascular:     Rate and Rhythm: Regular rhythm. Tachycardia present.     Heart sounds: No murmur heard.   No friction rub. No gallop.  Pulmonary:     Breath sounds: No stridor. No wheezing, rhonchi or rales.  Abdominal:     General: There is no distension.     Palpations: Abdomen is soft.     Tenderness: There is abdominal tenderness (lower abdominal, mild). There is rebound. There is no right CVA tenderness, left CVA tenderness or guarding.  Genitourinary:    Testes: Normal.     Rectum: Normal. Guaiac result negative.     Comments: On DRE, small external hemorrhoid noted, nonthrombosed appearing.  Soft stools palpated, Hemoccult negative.  No lesions, bleeding, or anal fissure visualized. Musculoskeletal:     Cervical back: Normal range of motion and neck supple.     Right lower leg: No edema.     Left lower leg: No edema.  Skin:    General: Skin is warm and dry.     Capillary Refill: Capillary refill takes less than 2 seconds.  Neurological:     General: No focal deficit present.     Mental Status: He is alert and oriented to person, place, and time.  Psychiatric:        Mood and Affect: Mood normal.        Behavior: Behavior normal.    ED Results / Procedures / Treatments   Labs (all labs ordered are listed, but only abnormal results are displayed) Labs Reviewed  COMPREHENSIVE METABOLIC PANEL - Abnormal; Notable for the following components:      Result Value   Sodium 133 (*)    Glucose, Bld 114 (*)    BUN <5 (*)    Total Protein 8.6 (*)    AST 135 (*)    ALT 59 (*)    Alkaline Phosphatase 131 (*)    All other components within normal limits  CBC - Abnormal; Notable for the following components:   WBC 18.5 (*)    All other components within normal limits  URINALYSIS, ROUTINE W REFLEX MICROSCOPIC - Abnormal; Notable for the following components:   Color, Urine  AMBER (*)    Hgb urine dipstick TRACE (*)    Protein, ur 30 (*)    All other components within normal limits  RESP PANEL BY RT-PCR (FLU A&B, COVID) ARPGX2  LIPASE, BLOOD  URINALYSIS, MICROSCOPIC (REFLEX)  HIV ANTIBODY (ROUTINE TESTING W REFLEX)  PROTIME-INR  COMPREHENSIVE METABOLIC PANEL  POC OCCULT BLOOD, ED    EKG None  Radiology CT ABDOMEN PELVIS W CONTRAST  Result Date: 01/02/2021 CLINICAL DATA:  Abdominal pain and fever. Smoker. Nurse triage notes describe rectal pain and lack of ability to have bowel movements. EXAM:  CT ABDOMEN AND PELVIS WITH CONTRAST TECHNIQUE: Multidetector CT imaging of the abdomen and pelvis was performed using the standard protocol following bolus administration of intravenous contrast. CONTRAST:  OMNIPAQUE IOHEXOL 300 MG/ML  SOLN COMPARISON:  None. FINDINGS: Lower chest: Right base scarring. Right lower lobe 5 mm pulmonary nodule on 15/5. 4 mm left lower lobe pulmonary nodule. Normal heart size without pericardial or pleural effusion. Hepatobiliary: Hepatomegaly at 22.2 cm craniocaudal. Mild caudate lobe enlargement. Normal gallbladder, without biliary ductal dilatation. Pancreas: Normal, without mass or ductal dilatation. Spleen: Normal in size, without focal abnormality. Adrenals/Urinary Tract: Normal adrenal glands. Normal kidneys, without hydronephrosis. Normal urinary bladder. Stomach/Bowel: Normal stomach, without wall thickening. Normal appendix, and terminal ileum. Normal small bowel. Subtle hypoattenuation within the deep pelvis including at 2.9 by 2.0 cm on 90/3. Favored to be positioned along the posterior wall of the anus. Also sagittal image 93. Vascular/Lymphatic: Aortic atherosclerosis. No abdominopelvic adenopathy. Reproductive: Normal prostate. Other: No significant free fluid. Musculoskeletal: No acute osseous abnormality. IMPRESSION: 1. Subtle hypoattenuation within the deep pelvis, suspicious for perianal/perirectal infection and developing  abscess. This area is sub optimally evaluated by CT. Recommend physical exam correlation. High-resolution MRI on a nonemergent basis would likely be of increased accuracy. 2. Hepatomegaly.  Mild caudate lobe enlargement is nonspecific. 3. Bibasilar pulmonary nodules. No follow-up needed if patient is low-risk. Non-contrast chest CT can be considered in 12 months if patient is high-risk. This recommendation follows the consensus statement: Guidelines for Management of Incidental Pulmonary Nodules Detected on CT Images: From the Fleischner Society 2017; Radiology 2017; 284:228-243. Electronically Signed   By: Jeronimo Greaves M.D.   On: 01/02/2021 18:51   US Abdomen Limited RUQ (LIVER/GB)  Result Date: 01/03/2021 CLINICAL DATA:  Elevated LFTs EXAM: ULTRASOUND ABDOMEN LIMITED RIGHT UPPER QUADRANT COMPARISON:  CT from the previous day. FINDINGS: Gallbladder: No gallstones or wall thickening visualized. No sonographic Murphy sign noted by sonographer. Common bile duct: Diameter: 3.1 mm. Liver: Slight increased echogenicity is noted consistent with fatty infiltration. No mass is seen. Portal vein is patent on color Doppler imaging with normal direction of blood flow towards the liver. Other: None. IMPRESSION: Fatty liver. No other focal abnormality is noted. Electronically Signed   By: Alcide Clever M.D.   On: 01/03/2021 00:54    Procedures Procedures   Medications Ordered in ED Medications  cefTRIAXone (ROCEPHIN) 2 g in sodium chloride 0.9 % 100 mL IVPB (has no administration in time range)  metroNIDAZOLE (FLAGYL) IVPB 500 mg (has no administration in time range)  bisacodyl (DULCOLAX) EC tablet 5 mg (has no administration in time range)  morphine 2 MG/ML injection 1 mg (1 mg Intravenous Given 01/03/21 0022)  morphine 2 MG/ML injection 1 mg (1 mg Intravenous Not Given 01/03/21 0030)  acetaminophen (TYLENOL) tablet 650 mg (650 mg Oral Given 01/02/21 1136)  lactated ringers bolus 1,000 mL (0 mLs Intravenous Stopped  01/02/21 1808)  ketorolac (TORADOL) 15 MG/ML injection 15 mg (15 mg Intravenous Given 01/02/21 1649)  iohexol (OMNIPAQUE) 300 MG/ML solution 100 mL (100 mLs Intravenous Contrast Given 01/02/21 1745)  piperacillin-tazobactam (ZOSYN) IVPB 3.375 g (0 g Intravenous Stopped 01/02/21 2218)    ED Course  I have reviewed the triage vital signs and the nursing notes.  Pertinent labs & imaging results that were available during my care of the patient were reviewed by me and considered in my medical decision making (see chart for details).    MDM Rules/Calculators/A&P  This is a 40 year old male with no PMHx presenting for rectal pain and constipation x1 week.  On exam, febrile to 101.45F and mildly tachycardic in 110s.  RLQ rebound tenderness present, although not significantly tender.  DRE without evidence of thrombosed hemorrhoid, soft stools palpated, Hemoccult negative, nontender.  Initial differentials: Tylenol and Toradol for pain with some improvement.  LR 1 L initiated for fluid resuscitation.  DDx included: Constipation, dehydration, poor fiber diet, metabolic derangement, malignancy, obstruction, thrombosed hemorrhoid, appendicitis, diverticulitis, IBS, perianal/perirectal abscess  All studies independently reviewed by myself, d/w the attending physician, factored into my MDM. -CT abdomen pelvis with contrast: Possible perianal/perirectal infection with developing abscess; hepatomegaly; bibasilar pulmonary nodules -CMP: Mild transaminitis, AST >ALT -CBC: WBC 18.5 -Unremarkable: Lipase, UA, fecal occult blood, COVID/influenza PCR  Presentation most concerning for perianal/perirectal inflammation/abscess and constipation.  It is possible that his significant EtOH use could contribute to dehydration.  No significant metabolic derangements on lab work.  Less likely malignancy in context of infectious symptoms, relative acuity of onset, and associated pain.  No evidence of  thrombosed hemorrhoid on exam.  Imaging suggests against appendicitis, diverticulitis.  Would be unusual to have onset of IBS at this age.  Discussed with general surgery, who was unable to evaluate the patient due to multiple OR cases.  Discussed with hospitalist service, who agreed to admit the patient.  Plan was discussed with the patient understands and agrees.  Patient HDS on reevaluation, nontoxic; subsequently admitted.  Final Clinical Impression(s) / ED Diagnoses Final diagnoses:  Constipation, unspecified constipation type  Rectal pain    Rx / DC Orders ED Discharge Orders     None        Colvin Caroli, MD 01/03/21 [id number]    Tilden Fossa, MD 01/06/21 [phone]

## 2021-01-02 NOTE — H&P (Signed)
History and Physical    Trevor Dixon RSW:[id number] DOB: [birth year] DOA: 01/02/2021  PCP: System, Provider Not In  Patient coming from: Home  I have personally briefly reviewed patient's old medical records in [facility]  Chief Complaint: Constipation and right buttocks pain  HPI: Trevor Dixon is a 40 y.o. male with no significant past medical history presenting with concerns of constipation and right buttocks pain.   patient reports that for the past several days he has not been able to have a bowel movement  . Does not usually have constipation.  Today developed worsening mid right buttocks pain that was so severe that he could not walk or get in and out of his car.  He also endorsed fever this morning.  He denies any nausea, or vomiting.  No abdominal pain.  He endorses drinking about three 24 ounce beers daily.  Denies any tobacco or illicit drug use.  ED Course: He was febrile up to 101.2, tachycardia and normotensive. WBC of 18.5. FOBT negative.Na 133. BG of 114. AST of 135, ALT of 59 and alkaline phosphate of 131.   CT abdomen and pelvis shows questionable deep pelvic abscess.  General surgery was consulted by ED physician.  He was also given a dose of IV Zosyn and hospitalist was called for admission.  Review of Systems: Constitutional: No Weight Change, + Fever ENT/Mouth: No sore throat, No Rhinorrhea Eyes: No Eye Pain, No Vision Changes Cardiovascular: No Chest Pain, no SOB Respiratory: No Cough, No Sputum Gastrointestinal: No Nausea, No Vomiting, No Diarrhea, + Constipation, No Pain Genitourinary: no Urinary Incontinence, No Urgency, No Flank Pain Musculoskeletal: No Arthralgias, No Myalgias Skin: No Skin Lesions, No Pruritus, Neuro: no Weakness, No Numbness Psych: No Anxiety/Panic, No Depression, no decrease appetite Heme/Lymph: No Bruising, No Bleeding  Past Medical History:  Diagnosis Date   Fracture of fifth toe, left, open 03/17/2016    Past  Surgical History:  Procedure Laterality Date   I & D EXTREMITY Left 03/20/2016   Procedure: IRRIGATION AND DEBRIDEMENT LEFT FOOT;  Surgeon: Tarry Kos, MD;  Location: [facility];  Service: Orthopedics;  Laterality: Left;  IRRIGATION AND DEBRIDEMENT LEFT FOOT   NO PAST SURGERIES     RUPTURED GLOBE EXPLORATION AND REPAIR Right 07/14/2016   Procedure: REPAIR OF RUPTURED GLOBE;  Surgeon: Marcelline Deist, MD;  Location[facility];  Service: Ophthalmology;  Laterality: Right;     reports that he has been smoking. He has never used smokeless tobacco. He reports current alcohol use. He reports that he does not use drugs. Social History  No Known Allergies  No family history on file.   Prior to Admission medications   Medication Sig Start Date End Date Taking? Authorizing Provider  HYDROcodone-acetaminophen (NORCO) 7.5-325 MG tablet Take 1-2 tablets by mouth every 6 (six) hours as needed for moderate pain. Patient not taking: No sig reported 03/20/16   Tarry Kos, MD  ondansetron (ZOFRAN) 4 MG tablet Take 1-2 tablets (4-8 mg total) by mouth every 8 (eight) hours as needed for nausea or vomiting. Patient not taking: No sig reported 03/20/16   Tarry Kos, MD  oxyCODONE-acetaminophen (PERCOCET/ROXICET) 5-325 MG tablet Take 1-2 tablets by mouth every 6 (six) hours as needed for moderate pain or severe pain. Patient not taking: No sig reported 03/18/16   Fayrene Helper, PA-C  senna-docusate (SENOKOT S) 8.6-50 MG tablet Take 1 tablet by mouth at bedtime as needed. Patient not taking: No sig reported 03/20/16  Tarry Kos, MD  traMADol (ULTRAM) 50 MG tablet Take 1 tablet (50 mg total) by mouth every 6 (six) hours as needed. Patient not taking: No sig reported 02/21/17   Geoffery Lyons, MD    Physical Exam: Vitals:   01/02/21 2045 01/02/21 2145 01/02/21 2245 01/02/21 2257  BP: 132/81 (!) 149/96 133/80   Pulse: (!) 102 (!) 108 (!) 101   Resp: 16 18 18    Temp:    100.1 F (37.8 C)   TempSrc:    Oral  SpO2: 97% 98% 100%   Weight:      Height:        Constitutional: NAD, calm, comfortable, nontoxic appearing young male laying flat in bed Vitals:   01/02/21 2045 01/02/21 2145 01/02/21 2245 01/02/21 2257  BP: 132/81 (!) 149/96 133/80   Pulse: (!) 102 (!) 108 (!) 101   Resp: 16 18 18    Temp:    100.1 F (37.8 C)  TempSrc:    Oral  SpO2: 97% 98% 100%   Weight:      Height:       Eyes: PERRL, lids and conjunctivae normal ENMT: Mucous membranes are moist.  Neck: normal, supple Respiratory: clear to auscultation bilaterally, no wheezing, no crackles. Normal respiratory effort. No accessory muscle use.  Cardiovascular: Regular rate and rhythm, no murmurs / rubs / gallops. No extremity edema. 2+ pedal pulses. No carotid bruits.  Abdomen: no tenderness, no masses palpated. Bowel sounds positive.  Rectal:pain with light palpation of rectal area at 12:00 but no obvious fluctuant mass.  Pain with palpation of right mid buttocks around ischial spine. Musculoskeletal: no clubbing / cyanosis. No joint deformity upper and lower extremities. Good ROM, no contractures. Normal muscle tone.  Skin: no rashes, lesions, ulcers. No induration Neurologic: CN 2-12 grossly intact. Sensation intact, Strength 5/5 in all 4.  Psychiatric: Normal judgment and insight. Alert and oriented x 3. Normal mood.     Labs on Admission: I have personally reviewed following labs and imaging studies  CBC: Recent Labs  Lab 01/02/21 1135  WBC 18.5*  HGB 14.7  HCT 42.9  MCV 92.7  PLT 296   Basic Metabolic Panel: Recent Labs  Lab 01/02/21 1135  NA 133*  K 4.3  CL 100  CO2 23  GLUCOSE 114*  BUN <5*  CREATININE 0.78  CALCIUM 9.1   GFR: Estimated Creatinine Clearance: 110.8 mL/min (by C-G formula based on SCr of 0.78 mg/dL). Liver Function Tests: Recent Labs  Lab 01/02/21 1135  AST 135*  ALT 59*  ALKPHOS 131*  BILITOT 1.1  PROT 8.6*  ALBUMIN 3.5   Recent Labs  Lab  01/02/21 1135  LIPASE 35   No results for input(s): AMMONIA in the last 168 hours. Coagulation Profile: No results for input(s): INR, PROTIME in the last 168 hours. Cardiac Enzymes: No results for input(s): CKTOTAL, CKMB, CKMBINDEX, TROPONINI in the last 168 hours. BNP (last 3 results) No results for input(s): PROBNP in the last 8760 hours. HbA1C: No results for input(s): HGBA1C in the last 72 hours. CBG: No results for input(s): GLUCAP in the last 168 hours. Lipid Profile: No results for input(s): CHOL, HDL, LDLCALC, TRIG, CHOLHDL, LDLDIRECT in the last 72 hours. Thyroid Function Tests: No results for input(s): TSH, T4TOTAL, FREET4, T3FREE, THYROIDAB in the last 72 hours. Anemia Panel: No results for input(s): VITAMINB12, FOLATE, FERRITIN, TIBC, IRON, RETICCTPCT in the last 72 hours. Urine analysis:    Component Value Date/Time   COLORURINE AMBER (  A) 01/02/2021 1135   APPEARANCEUR CLEAR 01/02/2021 1135   LABSPEC 1.025 01/02/2021 1135   PHURINE 6.0 01/02/2021 1135   GLUCOSEU NEGATIVE 01/02/2021 1135   HGBUR TRACE (A) 01/02/2021 1135   BILIRUBINUR NEGATIVE 01/02/2021 1135   KETONESUR NEGATIVE 01/02/2021 1135   PROTEINUR 30 (A) 01/02/2021 1135   NITRITE NEGATIVE 01/02/2021 1135   LEUKOCYTESUR NEGATIVE 01/02/2021 1135    Radiological Exams on Admission: CT ABDOMEN PELVIS W CONTRAST  Result Date: 01/02/2021 CLINICAL DATA:  Abdominal pain and fever. Smoker. Nurse triage notes describe rectal pain and lack of ability to have bowel movements. EXAM: CT ABDOMEN AND PELVIS WITH CONTRAST TECHNIQUE: Multidetector CT imaging of the abdomen and pelvis was performed using the standard protocol following bolus administration of intravenous contrast. CONTRAST:  100mL OMNIPAQUE IOHEXOL 300 MG/ML  SOLN COMPARISON:  None. FINDINGS: Lower chest: Right base scarring. Right lower lobe 5 mm pulmonary nodule on 15/5. 4 mm left lower lobe pulmonary nodule. Normal heart size without pericardial or  pleural effusion. Hepatobiliary: Hepatomegaly at 22.2 cm craniocaudal. Mild caudate lobe enlargement. Normal gallbladder, without biliary ductal dilatation. Pancreas: Normal, without mass or ductal dilatation. Spleen: Normal in size, without focal abnormality. Adrenals/Urinary Tract: Normal adrenal glands. Normal kidneys, without hydronephrosis. Normal urinary bladder. Stomach/Bowel: Normal stomach, without wall thickening. Normal appendix, and terminal ileum. Normal small bowel. Subtle hypoattenuation within the deep pelvis including at 2.9 by 2.0 cm on 90/3. Favored to be positioned along the posterior wall of the anus. Also sagittal image 93. Vascular/Lymphatic: Aortic atherosclerosis. No abdominopelvic adenopathy. Reproductive: Normal prostate. Other: No significant free fluid. Musculoskeletal: No acute osseous abnormality. IMPRESSION: 1. Subtle hypoattenuation within the deep pelvis, suspicious for perianal/perirectal infection and developing abscess. This area is sub optimally evaluated by CT. Recommend physical exam correlation. High-resolution MRI on a nonemergent basis would likely be of increased accuracy. 2. Hepatomegaly.  Mild caudate lobe enlargement is nonspecific. 3. Bibasilar pulmonary nodules. No follow-up needed if patient is low-risk. Non-contrast chest CT can be considered in 12 months if patient is high-risk. This recommendation follows the consensus statement: Guidelines for Management of Incidental Pulmonary Nodules Detected on CT Images: From the Fleischner Society 2017; Radiology 2017; 284:228-243. Electronically Signed   By: Jeronimo GreavesKyle  Talbot M.D.   On: 01/02/2021 18:51      Assessment/Plan  Rectal and right buttock/ischial spine pain Sepsis likely secondary to perianal abscess -Patient presented with fever and tachycardia -CT abdomen and pelvis concerning for perirectal abscess.  Obtain MRI to further assess.  General surgery to evaluate in the morning -Given initial dose of IV Zosyn  in the ED.  We will switch to Rocephin and Flagyl in the morning.  Elevated LFTs -He endorse drinking about three 24 oz daily -obtain RUQ ultrasound    DVT prophylaxis:.SCD Code Status: Full Family Communication: Plan discussed with patient at bedside  disposition Plan: Home with observation Consults called: General surgery Admission status: Observation  Level of care: Telemetry Medical  Status is: Observation  The patient remains OBS appropriate and will d/c before 2 midnights.  Dispo: The patient is from: Home              Anticipated d/c is to: Home              Patient currently is not medically stable to d/c.   Difficult to place patient No         Anselm Junglinghing T Klynn Linnemann DO [facility]   If 7PM-7AM, please contact night-coverage [email or web address]   01/02/2021,  11:45 PM

## 2021-01-02 NOTE — ED Notes (Signed)
Patient transported to CT 

## 2021-01-03 ENCOUNTER — Encounter (HOSPITAL_COMMUNITY): Payer: Self-pay | Admitting: Family Medicine

## 2021-01-03 ENCOUNTER — Inpatient Hospital Stay (HOSPITAL_COMMUNITY): Payer: Self-pay | Admitting: Certified Registered Nurse Anesthetist

## 2021-01-03 ENCOUNTER — Observation Stay (HOSPITAL_COMMUNITY): Payer: Self-pay

## 2021-01-03 ENCOUNTER — Encounter (HOSPITAL_COMMUNITY)
Admission: EM | Disposition: A | Discharge status: Home / Self Care | Payer: Self-pay | Source: Home / Self Care | Attending: Family Medicine

## 2021-01-03 DIAGNOSIS — K5900 Constipation, unspecified: Secondary | ICD-10-CM

## 2021-01-03 DIAGNOSIS — K6289 Other specified diseases of anus and rectum: Secondary | ICD-10-CM

## 2021-01-03 DIAGNOSIS — Z789 Other specified health status: Secondary | ICD-10-CM

## 2021-01-03 DIAGNOSIS — Z7289 Other problems related to lifestyle: Secondary | ICD-10-CM

## 2021-01-03 DIAGNOSIS — R7989 Other specified abnormal findings of blood chemistry: Secondary | ICD-10-CM

## 2021-01-03 HISTORY — PX: INCISION AND DRAINAGE ABSCESS: SHX5864

## 2021-01-03 LAB — COMPREHENSIVE METABOLIC PANEL
ALT: 45 U/L — ABNORMAL HIGH (ref 0–44)
AST: 99 U/L — ABNORMAL HIGH (ref 15–41)
Albumin: 3 g/dL — ABNORMAL LOW (ref 3.5–5.0)
Alkaline Phosphatase: 117 U/L (ref 38–126)
Anion gap: 7 (ref 5–15)
BUN: 6 mg/dL (ref 6–20)
CO2: 22 mmol/L (ref 22–32)
Calcium: 8.5 mg/dL — ABNORMAL LOW (ref 8.9–10.3)
Chloride: 102 mmol/L (ref 98–111)
Creatinine, Ser: 0.72 mg/dL (ref 0.61–1.24)
GFR, Estimated: >60 mL/min (ref >60)
Glucose, Bld: 112 mg/dL — ABNORMAL HIGH (ref 70–99)
Potassium: 3.6 mmol/L (ref 3.5–5.1)
Sodium: 131 mmol/L — ABNORMAL LOW (ref 135–145)
Total Bilirubin: 0.9 mg/dL (ref 0.3–1.2)
Total Protein: 7.9 g/dL (ref 6.5–8.1)

## 2021-01-03 LAB — SURGICAL PCR SCREEN
MRSA, PCR: NEGATIVE
Staphylococcus aureus: POSITIVE — AB

## 2021-01-03 LAB — HIV ANTIBODY (ROUTINE TESTING W REFLEX): HIV Screen 4th Generation wRfx: NONREACTIVE

## 2021-01-03 LAB — PROTIME-INR
INR: 1.3 — ABNORMAL HIGH (ref 0.8–1.2)
Prothrombin Time: 15.8 seconds — ABNORMAL HIGH (ref 11.4–15.2)

## 2021-01-03 SURGERY — INCISION AND DRAINAGE, ABSCESS
Anesthesia: General

## 2021-01-03 MED ORDER — METRONIDAZOLE 500 MG/100ML IV SOLN
INTRAVENOUS | Status: DC | PRN
  Administered 2021-01-03: 500 mg via INTRAVENOUS

## 2021-01-03 MED ORDER — PROPOFOL 10 MG/ML IV BOLUS
INTRAVENOUS | Status: DC | PRN
  Administered 2021-01-03: 200 mg via INTRAVENOUS
  Administered 2021-01-03: 30 mg via INTRAVENOUS

## 2021-01-03 MED ORDER — PROPOFOL 10 MG/ML IV BOLUS
INTRAVENOUS | Status: AC
  Filled 2021-01-03: qty 20

## 2021-01-03 MED ORDER — DEXMEDETOMIDINE (PRECEDEX) IN NS 20 MCG/5ML (4 MCG/ML) IV SYRINGE
PREFILLED_SYRINGE | INTRAVENOUS | Status: DC | PRN
  Administered 2021-01-03: 8 ug via INTRAVENOUS
  Administered 2021-01-03: 12 ug via INTRAVENOUS

## 2021-01-03 MED ORDER — IPRATROPIUM-ALBUTEROL 0.5-2.5 (3) MG/3ML IN SOLN
3.0000 mL | Freq: Once | RESPIRATORY_TRACT | Status: AC
  Administered 2021-01-03: 3 mL via RESPIRATORY_TRACT

## 2021-01-03 MED ORDER — OXYMETAZOLINE HCL 0.05 % NA SOLN
1.0000 | Freq: Two times a day (BID) | NASAL | Status: DC
  Administered 2021-01-03: 1 via NASAL

## 2021-01-03 MED ORDER — OXYCODONE HCL 5 MG PO TABS: 5.0000 mg | ORAL_TABLET | Freq: Once | ORAL | Status: DC | PRN

## 2021-01-03 MED ORDER — OXYMETAZOLINE HCL 0.05 % NA SOLN
NASAL | Status: AC
  Administered 2021-01-03: 1
  Filled 2021-01-03: qty 30

## 2021-01-03 MED ORDER — ALBUMIN HUMAN 5 % IV SOLN
12.5000 g | Freq: Once | INTRAVENOUS | Status: AC
  Administered 2021-01-03: 12.5 g via INTRAVENOUS

## 2021-01-03 MED ORDER — KETOROLAC TROMETHAMINE 15 MG/ML IJ SOLN
30.0000 mg | Freq: Four times a day (QID) | INTRAMUSCULAR | Status: AC
  Administered 2021-01-03 – 2021-01-08 (×18): 30 mg via INTRAVENOUS
  Filled 2021-01-03 (×18): qty 2

## 2021-01-03 MED ORDER — ONDANSETRON HCL 4 MG/2ML IJ SOLN
INTRAMUSCULAR | Status: AC
  Filled 2021-01-03: qty 2

## 2021-01-03 MED ORDER — ONDANSETRON HCL 4 MG/2ML IJ SOLN
INTRAMUSCULAR | Status: DC | PRN
  Administered 2021-01-03: 4 mg via INTRAVENOUS

## 2021-01-03 MED ORDER — CHLORHEXIDINE GLUCONATE CLOTH 2 % EX PADS
6.0000 | MEDICATED_PAD | Freq: Every day | CUTANEOUS | Status: AC
  Administered 2021-01-03 – 2021-01-07 (×4): 6 via TOPICAL

## 2021-01-03 MED ORDER — ALBUMIN HUMAN 5 % IV SOLN
INTRAVENOUS | Status: AC
  Filled 2021-01-03: qty 250

## 2021-01-03 MED ORDER — BUPIVACAINE-EPINEPHRINE (PF) 0.25% -1:200000 IJ SOLN
INTRAMUSCULAR | Status: AC
  Filled 2021-01-03: qty 30

## 2021-01-03 MED ORDER — DEXAMETHASONE SODIUM PHOSPHATE 10 MG/ML IJ SOLN
INTRAMUSCULAR | Status: AC
  Filled 2021-01-03: qty 1

## 2021-01-03 MED ORDER — MIDAZOLAM HCL 2 MG/2ML IJ SOLN
INTRAMUSCULAR | Status: AC
  Filled 2021-01-03: qty 2

## 2021-01-03 MED ORDER — CHLORHEXIDINE GLUCONATE 0.12 % MT SOLN: 15.0000 mL | Freq: Once | OROMUCOSAL | Status: AC

## 2021-01-03 MED ORDER — PROMETHAZINE HCL 25 MG/ML IJ SOLN: 6.2500 mg | INTRAMUSCULAR | Status: DC | PRN

## 2021-01-03 MED ORDER — LIDOCAINE HCL 1 % IJ SOLN
INTRAMUSCULAR | Status: AC
  Filled 2021-01-03: qty 20

## 2021-01-03 MED ORDER — ROCURONIUM BROMIDE 10 MG/ML (PF) SYRINGE
PREFILLED_SYRINGE | INTRAVENOUS | Status: AC
  Filled 2021-01-03: qty 10

## 2021-01-03 MED ORDER — CHLORHEXIDINE GLUCONATE 0.12 % MT SOLN
OROMUCOSAL | Status: AC
  Administered 2021-01-03: 15 mL via OROMUCOSAL
  Filled 2021-01-03: qty 15

## 2021-01-03 MED ORDER — OXYCODONE HCL 5 MG/5ML PO SOLN
5.0000 mg | ORAL | Status: DC | PRN
  Administered 2021-01-03 – 2021-01-07 (×3): 10 mg via ORAL
  Administered 2021-01-09: 5 mg via ORAL
  Administered 2021-01-09: 10 mg via ORAL
  Administered 2021-01-12 (×2): 5 mg via ORAL
  Administered 2021-01-13: 10 mg via ORAL
  Administered 2021-01-13: 5 mg via ORAL
  Filled 2021-01-03 (×2): qty 10
  Filled 2021-01-03 (×2): qty 5
  Filled 2021-01-03: qty 10
  Filled 2021-01-03: qty 5
  Filled 2021-01-03: qty 10
  Filled 2021-01-03 (×2): qty 5
  Filled 2021-01-03: qty 10

## 2021-01-03 MED ORDER — FENTANYL CITRATE (PF) 250 MCG/5ML IJ SOLN
INTRAMUSCULAR | Status: DC | PRN
  Administered 2021-01-03: 150 ug via INTRAVENOUS
  Administered 2021-01-03: 100 ug via INTRAVENOUS

## 2021-01-03 MED ORDER — MUPIROCIN 2 % EX OINT
1.0000 "application " | TOPICAL_OINTMENT | Freq: Two times a day (BID) | CUTANEOUS | Status: AC
  Administered 2021-01-03 – 2021-01-07 (×9): 1 via NASAL
  Filled 2021-01-03 (×3): qty 22

## 2021-01-03 MED ORDER — FENTANYL CITRATE (PF) 100 MCG/2ML IJ SOLN
INTRAMUSCULAR | Status: AC
  Filled 2021-01-03: qty 2

## 2021-01-03 MED ORDER — DEXMEDETOMIDINE (PRECEDEX) IN NS 20 MCG/5ML (4 MCG/ML) IV SYRINGE
PREFILLED_SYRINGE | INTRAVENOUS | Status: AC
  Filled 2021-01-03: qty 5

## 2021-01-03 MED ORDER — SODIUM CHLORIDE 0.9 % IV SOLN: INTRAVENOUS | Status: AC

## 2021-01-03 MED ORDER — AMISULPRIDE (ANTIEMETIC) 5 MG/2ML IV SOLN: 10.0000 mg | Freq: Once | INTRAVENOUS | Status: DC | PRN

## 2021-01-03 MED ORDER — OXYCODONE HCL 5 MG/5ML PO SOLN: 5.0000 mg | Freq: Once | ORAL | Status: DC | PRN

## 2021-01-03 MED ORDER — MIDAZOLAM HCL 5 MG/5ML IJ SOLN
INTRAMUSCULAR | Status: DC | PRN
  Administered 2021-01-03: 2 mg via INTRAVENOUS

## 2021-01-03 MED ORDER — SUGAMMADEX SODIUM 200 MG/2ML IV SOLN
INTRAVENOUS | Status: DC | PRN
  Administered 2021-01-03 (×2): 200 mg via INTRAVENOUS

## 2021-01-03 MED ORDER — LIDOCAINE 2% (20 MG/ML) 5 ML SYRINGE
INTRAMUSCULAR | Status: DC | PRN
  Administered 2021-01-03: 60 mg via INTRAVENOUS

## 2021-01-03 MED ORDER — FENTANYL CITRATE (PF) 250 MCG/5ML IJ SOLN
INTRAMUSCULAR | Status: AC
  Filled 2021-01-03: qty 5

## 2021-01-03 MED ORDER — GADOBUTROL 1 MMOL/ML IV SOLN
7.0000 mL | Freq: Once | INTRAVENOUS | Status: AC | PRN
  Administered 2021-01-03: 7 mL via INTRAVENOUS

## 2021-01-03 MED ORDER — ORAL CARE MOUTH RINSE: 15.0000 mL | Freq: Once | OROMUCOSAL | Status: AC

## 2021-01-03 MED ORDER — DEXAMETHASONE SODIUM PHOSPHATE 10 MG/ML IJ SOLN
INTRAMUSCULAR | Status: DC | PRN
  Administered 2021-01-03: 5 mg via INTRAVENOUS

## 2021-01-03 MED ORDER — ROCURONIUM BROMIDE 10 MG/ML (PF) SYRINGE
PREFILLED_SYRINGE | INTRAVENOUS | Status: DC | PRN
  Administered 2021-01-03: 30 mg via INTRAVENOUS

## 2021-01-03 MED ORDER — LACTATED RINGERS IV SOLN: INTRAVENOUS | Status: DC

## 2021-01-03 MED ORDER — IPRATROPIUM-ALBUTEROL 0.5-2.5 (3) MG/3ML IN SOLN
RESPIRATORY_TRACT | Status: AC
  Filled 2021-01-03: qty 3

## 2021-01-03 MED ORDER — FENTANYL CITRATE (PF) 100 MCG/2ML IJ SOLN
25.0000 ug | INTRAMUSCULAR | Status: DC | PRN
  Administered 2021-01-03: 25 ug via INTRAVENOUS
  Administered 2021-01-03: 50 ug via INTRAVENOUS

## 2021-01-03 MED ORDER — MORPHINE SULFATE (PF) 2 MG/ML IV SOLN: 2.0000 mg | INTRAVENOUS | Status: DC | PRN

## 2021-01-03 MED ORDER — LIDOCAINE 2% (20 MG/ML) 5 ML SYRINGE
INTRAMUSCULAR | Status: AC
  Filled 2021-01-03: qty 5

## 2021-01-03 MED ORDER — ACETAMINOPHEN 500 MG PO TABS
1000.0000 mg | ORAL_TABLET | Freq: Four times a day (QID) | ORAL | Status: DC
  Administered 2021-01-03 – 2021-01-04 (×4): 1000 mg via ORAL
  Filled 2021-01-03 (×4): qty 2

## 2021-01-03 MED ORDER — LIDOCAINE HCL 1 % IJ SOLN
INTRAMUSCULAR | Status: DC | PRN
  Administered 2021-01-03: 10 mL

## 2021-01-03 MED ORDER — IPRATROPIUM-ALBUTEROL 0.5-2.5 (3) MG/3ML IN SOLN: 3.0000 mL | RESPIRATORY_TRACT | Status: DC

## 2021-01-03 SURGICAL SUPPLY — 36 items
BAG COUNTER SPONGE SURGICOUNT (BAG) ×2 IMPLANT
BNDG GAUZE ELAST 4 BULKY (GAUZE/BANDAGES/DRESSINGS) IMPLANT
CANISTER SUCT 3000ML PPV (MISCELLANEOUS) ×2 IMPLANT
COVER SURGICAL LIGHT HANDLE (MISCELLANEOUS) ×2 IMPLANT
DECANTER SPIKE VIAL GLASS SM (MISCELLANEOUS) ×2 IMPLANT
DRAPE LAPAROSCOPIC ABDOMINAL (DRAPES) IMPLANT
DRAPE LAPAROTOMY 100X72 PEDS (DRAPES) ×2 IMPLANT
DRSG PAD ABDOMINAL 8X10 ST (GAUZE/BANDAGES/DRESSINGS) ×2 IMPLANT
ELECT CAUTERY BLADE 6.4 (BLADE) ×2 IMPLANT
ELECT REM PT RETURN 9FT ADLT (ELECTROSURGICAL) ×2
ELECTRODE REM PT RTRN 9FT ADLT (ELECTROSURGICAL) ×1 IMPLANT
GAUZE PACKING IODOFORM 1X5 (PACKING) ×2 IMPLANT
GAUZE SPONGE 4X4 12PLY STRL (GAUZE/BANDAGES/DRESSINGS) ×2 IMPLANT
GLOVE SURG ENC MOIS LTX SZ7 (GLOVE) ×2 IMPLANT
GLOVE SURG UNDER POLY LF SZ7.5 (GLOVE) ×2 IMPLANT
GOWN STRL REUS W/ TWL LRG LVL3 (GOWN DISPOSABLE) ×2 IMPLANT
GOWN STRL REUS W/TWL LRG LVL3 (GOWN DISPOSABLE) ×2
KIT BASIN OR (CUSTOM PROCEDURE TRAY) ×2 IMPLANT
KIT SIGMOIDOSCOPE (SET/KITS/TRAYS/PACK) IMPLANT
KIT TURNOVER KIT B (KITS) ×2 IMPLANT
NEEDLE HYPO 25GX1X1/2 BEV (NEEDLE) ×2 IMPLANT
NS IRRIG 1000ML POUR BTL (IV SOLUTION) ×2 IMPLANT
PACK GENERAL/GYN (CUSTOM PROCEDURE TRAY) ×2 IMPLANT
PAD ABD 7.5X8 STRL (GAUZE/BANDAGES/DRESSINGS) ×2 IMPLANT
PAD ARMBOARD 7.5X6 YLW CONV (MISCELLANEOUS) ×4 IMPLANT
PENCIL SMOKE EVACUATOR (MISCELLANEOUS) ×2 IMPLANT
SPONGE SURGIFOAM ABS GEL 100 (HEMOSTASIS) IMPLANT
SURGILUBE 2OZ TUBE FLIPTOP (MISCELLANEOUS) ×2 IMPLANT
SUT CHROMIC 2 0 SH (SUTURE) IMPLANT
SUT MON AB 3-0 SH 27 (SUTURE)
SUT MON AB 3-0 SH27 (SUTURE) IMPLANT
SWAB COLLECTION DEVICE MRSA (MISCELLANEOUS) IMPLANT
SWAB CULTURE ESWAB REG 1ML (MISCELLANEOUS) IMPLANT
SYR CONTROL 10ML LL (SYRINGE) ×2 IMPLANT
TOWEL GREEN STERILE (TOWEL DISPOSABLE) ×2 IMPLANT
TOWEL GREEN STERILE FF (TOWEL DISPOSABLE) ×2 IMPLANT

## 2021-01-03 NOTE — Progress Notes (Signed)
Subjective/Chief Complaint: Complains of perianal pain via AMN interpreter   Objective: Vital signs in last 24 hours: Temp:  [98.4 F (36.9 C)-100.2 F (37.9 C)] 98.7 F (37.1 C) (07/15 1113) Pulse Rate:  [84-116] 84 (07/15 1113) Resp:  [14-19] 16 (07/15 1113) BP: (98-153)/(60-100) 106/73 (07/15 1113) SpO2:  [95 %-100 %] 99 % (07/15 1113) Last BM Date: 12/30/20  Intake/Output from previous day: 07/14 0701 - 07/15 0700 In: 1528 [P.O.:60; I.V.:269.2; IV Piggyback:1198.8] Out: -  Intake/Output this shift: No intake/output data recorded.  Posterior perianal tenderness   Lab Results:  Recent Labs    01/02/21 1135  WBC 18.5*  HGB 14.7  HCT 42.9  PLT 296   BMET Recent Labs    01/02/21 1135 01/03/21 0246  NA 133* 131*  K 4.3 3.6  CL 100 102  CO2 23 22  GLUCOSE 114* 112*  BUN <5* 6  CREATININE 0.78 0.72  CALCIUM 9.1 8.5*   PT/INR Recent Labs    01/03/21 0246  LABPROT 15.8*  INR 1.3*   ABG No results for input(s): PHART, HCO3 in the last 72 hours.  Invalid input(s): PCO2, PO2  Studies/Results: DG Orbits  Result Date: 01/03/2021 CLINICAL DATA:  Metal working/exposure; clearance prior to MRI EXAM: ORBITS - COMPLETE 4+ VIEW COMPARISON:  None. FINDINGS: There is no evidence of metallic foreign body within the orbits. No significant bone abnormality identified. IMPRESSION: No evidence of metallic foreign body within the orbits. Electronically Signed   By: Alcide Clever M.D.   On: 01/03/2021 01:54   MR PELVIS W WO CONTRAST  Result Date: 01/03/2021 CLINICAL DATA:  Abdominal abscess constipation rectal EXAM: MRI PELVIS WITHOUT AND WITH CONTRAST TECHNIQUE: Multiplanar multisequence MR imaging of the pelvis was performed both before and after administration of intravenous contrast. CONTRAST:  72mL GADAVIST GADOBUTROL 1 MMOL/ML IV SOLN COMPARISON:  CT abdomen pelvis 01/02/2021 FINDINGS: Urinary Tract:  No abnormality visualized. Bowel: There is a rim enhancing  perianal abscess posterior to the anal canal, 6 o'clock face in lithotomy position, measuring approximately 3.8 x 3.4 x 1.9 cm (series 8, image 30, series 3, image 36). Otherwise unremarkable visualized pelvic bowel loops. Vascular/Lymphatic: Small, prominent, reactive perirectal lymph nodes (series 9, image 18). No significant vascular abnormality seen. Reproductive:  No mass or other significant abnormality Other:  None. Musculoskeletal: No suspicious bone lesions identified. IMPRESSION: 1. Rim enhancing perianal abscess posterior to the anal canal, 6 o'clock face in lithotomy position, measuring approximately 3.8 x 3.4 x 1.9 cm. 2. Small, prominent, reactive perirectal lymph nodes. Preliminary findings were documented by Dr. Gwenyth Bender. Electronically Signed   By: Lauralyn Primes M.D.   On: 01/03/2021 08:19   CT ABDOMEN PELVIS W CONTRAST  Result Date: 01/02/2021 CLINICAL DATA:  Abdominal pain and fever. Smoker. Nurse triage notes describe rectal pain and lack of ability to have bowel movements. EXAM: CT ABDOMEN AND PELVIS WITH CONTRAST TECHNIQUE: Multidetector CT imaging of the abdomen and pelvis was performed using the standard protocol following bolus administration of intravenous contrast. CONTRAST:  OMNIPAQUE IOHEXOL 300 MG/ML  SOLN COMPARISON:  None. FINDINGS: Lower chest: Right base scarring. Right lower lobe 5 mm pulmonary nodule on 15/5. 4 mm left lower lobe pulmonary nodule. Normal heart size without pericardial or pleural effusion. Hepatobiliary: Hepatomegaly at 22.2 cm craniocaudal. Mild caudate lobe enlargement. Normal gallbladder, without biliary ductal dilatation. Pancreas: Normal, without mass or ductal dilatation. Spleen: Normal in size, without focal abnormality. Adrenals/Urinary Tract: Normal adrenal glands. Normal kidneys, without hydronephrosis.  Normal urinary bladder. Stomach/Bowel: Normal stomach, without wall thickening. Normal appendix, and terminal ileum. Normal small bowel. Subtle  hypoattenuation within the deep pelvis including at 2.9 by 2.0 cm on 90/3. Favored to be positioned along the posterior wall of the anus. Also sagittal image 93. Vascular/Lymphatic: Aortic atherosclerosis. No abdominopelvic adenopathy. Reproductive: Normal prostate. Other: No significant free fluid. Musculoskeletal: No acute osseous abnormality. IMPRESSION: 1. Subtle hypoattenuation within the deep pelvis, suspicious for perianal/perirectal infection and developing abscess. This area is sub optimally evaluated by CT. Recommend physical exam correlation. High-resolution MRI on a nonemergent basis would likely be of increased accuracy. 2. Hepatomegaly.  Mild caudate lobe enlargement is nonspecific. 3. Bibasilar pulmonary nodules. No follow-up needed if patient is low-risk. Non-contrast chest CT can be considered in 12 months if patient is high-risk. This recommendation follows the consensus statement: Guidelines for Management of Incidental Pulmonary Nodules Detected on CT Images: From the Fleischner Society 2017; Radiology 2017; 284:228-243. Electronically Signed   By: Jeronimo Greaves M.D.   On: 01/02/2021 18:51   US Abdomen Limited RUQ (LIVER/GB)  Result Date: 01/03/2021 CLINICAL DATA:  Elevated LFTs EXAM: ULTRASOUND ABDOMEN LIMITED RIGHT UPPER QUADRANT COMPARISON:  CT from the previous day. FINDINGS: Gallbladder: No gallstones or wall thickening visualized. No sonographic Murphy sign noted by sonographer. Common bile duct: Diameter: 3.1 mm. Liver: Slight increased echogenicity is noted consistent with fatty infiltration. No mass is seen. Portal vein is patent on color Doppler imaging with normal direction of blood flow towards the liver. Other: None. IMPRESSION: Fatty liver. No other focal abnormality is noted. Electronically Signed   By: Alcide Clever M.D.   On: 01/03/2021 00:54    Anti-infectives: Anti-infectives (From admission, onward)    Start     Dose/Rate Route Frequency Ordered Stop   01/03/21 0000   cefTRIAXone (ROCEPHIN) 2 g in sodium chloride 0.9 % 100 mL IVPB        2 g 200 mL/hr over 30 Minutes Intravenous Every 24 hours 01/02/21 2218     01/03/21 0000  metroNIDAZOLE (FLAGYL) IVPB 500 mg        500 mg 100 mL/hr over 60 Minutes Intravenous Every 8 hours 01/02/21 2218     01/02/21 2000  piperacillin-tazobactam (ZOSYN) IVPB 3.375 g        3.375 g 100 mL/hr over 30 Minutes Intravenous  Once 01/02/21 1948 01/02/21 2218       Assessment/Plan: Perianal abscess -to OR today for I/D -procedure and risks discussed with patient via translator  Trevor Dixon 01/03/2021

## 2021-01-03 NOTE — Anesthesia Procedure Notes (Signed)
Procedure Name: Intubation Date/Time: 01/03/2021 4:00 PM Performed by: Aundria Rud, CRNA Pre-anesthesia Checklist: Patient identified, Emergency Drugs available, Suction available and Patient being monitored Patient Re-evaluated:Patient Re-evaluated prior to induction Oxygen Delivery Method: Circle system utilized Preoxygenation: Pre-oxygenation with 100% oxygen Induction Type: IV induction Ventilation: Mask ventilation without difficulty Grade View: Grade I Tube type: Oral Tube size: 7.5 mm Number of attempts: 1 Airway Equipment and Method: Stylet Placement Confirmation: ETT inserted through vocal cords under direct vision, positive ETCO2 and breath sounds checked- equal and bilateral Secured at: 22 cm Tube secured with: Tape Dental Injury: Teeth and Oropharynx as per pre-operative assessment

## 2021-01-03 NOTE — Op Note (Signed)
Preoperative diagnosis: Perianal abscess Postoperative diagnosis: Same as above Procedure: Incision and drainage of perianal abscess Surgeon: Dr. Harden Mo Anesthesia: General Complications: None Drains: None Estimated blood loss: Minimal Specimens: Cultures to microbiology Sponge needle count was correct completion Decision recovery stable condition  Indications: This is a 40 year old male who was admitted overnight with perianal pain.  He underwent a CAT scan and MRI of his pelvis to show that he had a posterior perianal abscess.  This was obvious on my exam.  I took him to the operating room to drain this as we discussed with translator assistance.  Procedure: After informed consent was obtained the patient was taken to the operating room.  He was already on antibiotics.  SCDs were in place.  He was placed under general anesthesia without complication.  He was then rolled into the prone position.  He was prepped and draped in the standard sterile surgical fashion.  Surgical timeout was then performed.  The area was already draining purulence.  This was a posterior abscess.  I did a perianal block.  I then did cultures of the purulence.  I then used an 11 blade to enlarge the opening.  This was widely opened.  It was adequately drained with a fair amount of purulence present.  I irrigated this copiously.  I then packed it with iodoform.  Dressings were placed.  He tolerated this well was transferred to recovery stable.

## 2021-01-03 NOTE — Anesthesia Preprocedure Evaluation (Addendum)
Anesthesia Evaluation  Patient identified by MRN, date of birth, ID band Patient awake    Reviewed: Allergy & Precautions, NPO status , Patient's Chart, lab work & pertinent test results  Airway Mallampati: III  TM Distance: >3 FB Neck ROM: Full    Dental no notable dental hx.    Pulmonary former smoker,    Pulmonary exam normal breath sounds clear to auscultation       Cardiovascular negative cardio ROS Normal cardiovascular exam Rhythm:Regular Rate:Normal     Neuro/Psych negative neurological ROS  negative psych ROS   GI/Hepatic negative GI ROS, (+)     substance abuse  ,   Endo/Other  negative endocrine ROS  Renal/GU negative Renal ROS     Musculoskeletal negative musculoskeletal ROS (+)   Abdominal   Peds  Hematology negative hematology ROS (+)   Anesthesia Other Findings RECTUM ABSCESS  Reproductive/Obstetrics                            Anesthesia Physical Anesthesia Plan  ASA: 2  Anesthesia Plan: General   Post-op Pain Management:    Induction: Intravenous  PONV Risk Score and Plan: 1 and Ondansetron, Dexamethasone, Midazolam and Treatment may vary due to age or medical condition  Airway Management Planned: Oral ETT  Additional Equipment:   Intra-op Plan:   Post-operative Plan: Extubation in OR  Informed Consent: I have reviewed the patients History and Physical, chart, labs and discussed the procedure including the risks, benefits and alternatives for the proposed anesthesia with the patient or authorized representative who has indicated his/her understanding and acceptance.     Dental advisory given and Interpreter used for interveiw  Plan Discussed with: CRNA  Anesthesia Plan Comments:         Anesthesia Quick Evaluation

## 2021-01-03 NOTE — Transfer of Care (Signed)
Immediate Anesthesia Transfer of Care Note  Patient: Trevor Dixon  Procedure(s) Performed: INCISION AND DRAINAGE ABSCESS EXAM UNDER ANESTHESIA  Patient Location: PACU  Anesthesia Type:General  Level of Consciousness: drowsy, lethargic and responds to stimulation  Airway & Oxygen Therapy: Patient Spontanous Breathing and Patient connected to face mask oxygen  Post-op Assessment: Report given to RN and Post -op Vital signs reviewed and unstable, Anesthesiologist notified  Post vital signs: unstable; O2 sats low. MD at bedside.   Last Vitals:  Vitals Value Taken Time  BP 116/62 01/03/21 1651  Temp    Pulse 101 01/03/21 1658  Resp 25 01/03/21 1658  SpO2 79 % 01/03/21 1658  Vitals shown include unvalidated device data.  Last Pain:  Vitals:   01/03/21 1113  TempSrc: Oral  PainSc:          Complications: No notable events documented.

## 2021-01-03 NOTE — Consult Note (Signed)
Reason for Consult/Chief Complaint: peri-rectal abscess Consultant: Ronette Deter, MD  Trevor Dixon is an 40 y.o. male.   HPI: 87M with a one week history of being "unable to poop", pain (points to his R buttocks and states "from where you poop"), and subjective fevers. Last BM was 7/14 AM and was small. Has been drinking banana and strawberry juice to alleviate his symptoms, has not taken any medications. No prior colonoscopy. Denies prior history of peri-rectal abscess.   Past Medical History:  Diagnosis Date   Fracture of fifth toe, left, open 03/17/2016    Past Surgical History:  Procedure Laterality Date   I & D EXTREMITY Left 03/20/2016   Procedure: IRRIGATION AND DEBRIDEMENT LEFT FOOT;  Surgeon: Trevor Kos, MD;  Location: [facility];  Service: Orthopedics;  Laterality: Left;  IRRIGATION AND DEBRIDEMENT LEFT FOOT   NO PAST SURGERIES     RUPTURED GLOBE EXPLORATION AND REPAIR Right 07/14/2016   Procedure: REPAIR OF RUPTURED GLOBE;  Surgeon: Trevor Deist, MD;  Location: [facility] OR;  Service: Ophthalmology;  Laterality: Right;    No family history on file.  Social History:  reports that he has been smoking. He has never used smokeless tobacco. He reports current alcohol use. He reports that he does not use drugs.  Allergies: No Known Allergies  Medications: I have reviewed the patient's current medications.  Results for orders placed or performed during the hospital encounter of 01/02/21 (from the past 48 hour(s))  Lipase, blood     Status: None   Collection Time: 01/02/21 11:35 AM  Result Value Ref Range   Lipase 35 11 - 51 U/L    Comment: Performed at [facility], [address], [place], [place] [address]  Comprehensive metabolic panel     Status: Abnormal   Collection Time: 01/02/21 11:35 AM  Result Value Ref Range   Sodium 133 (L) 135 - 145 mmol/L   Potassium 4.3 3.5 - 5.1 mmol/L   Chloride 100 98 - 111 mmol/L   CO2 23 22 - 32 mmol/L    Glucose, Bld 114 (H) 70 - 99 mg/dL    Comment: Glucose reference range applies only to samples taken after fasting for at least 8 hours.   BUN <5 (L) 6 - 20 mg/dL   Creatinine, Ser 3.78 0.61 - 1.24 mg/dL   Calcium 9.1 8.9 - 58.8 mg/dL   Total Protein 8.6 (H) 6.5 - 8.1 g/dL   Albumin 3.5 3.5 - 5.0 g/dL   AST 502 (H) 15 - 41 U/L   ALT 59 (H) 0 - 44 U/L   Alkaline Phosphatase 131 (H) 38 - 126 U/L   Total Bilirubin 1.1 0.3 - 1.2 mg/dL   GFR, Estimated >77 >41 mL/min    Comment: (NOTE) Calculated using the CKD-EPI Creatinine Equation (2021)    Anion gap 10 5 - 15    Comment: Performed at [facility], [address] [place], [place] [address]  CBC     Status: Abnormal   Collection Time: 01/02/21 11:35 AM  Result Value Ref Range   WBC 18.5 (H) 4.0 - 10.5 K/uL   RBC 4.63 4.22 - 5.81 MIL/uL   Hemoglobin 14.7 13.0 - 17.0 g/dL   HCT 76.7 20.9 - 47.0 %   MCV 92.7 80.0 - 100.0 fL   MCH 31.7 26.0 - 34.0 pg   MCHC 34.3 30.0 - 36.0 g/dL   RDW 96.2 83.6 - 62.9 %  Platelets 296 150 - 400 K/uL   nRBC 0.0 0.0 - 0.2 %    Comment: Performed at [facility], [address], [place], [place] [address]  Urinalysis, Routine w reflex microscopic Urine, Clean Catch     Status: Abnormal   Collection Time: 01/02/21 11:35 AM  Result Value Ref Range   Color, Urine AMBER (A) YELLOW    Comment: BIOCHEMICALS MAY BE AFFECTED BY COLOR   APPearance CLEAR CLEAR   Specific Gravity, Urine 1.025 1.005 - 1.030   pH 6.0 5.0 - 8.0   Glucose, UA NEGATIVE NEGATIVE mg/dL   Hgb urine dipstick TRACE (A) NEGATIVE   Bilirubin Urine NEGATIVE NEGATIVE   Ketones, ur NEGATIVE NEGATIVE mg/dL   Protein, ur 30 (A) NEGATIVE mg/dL   Nitrite NEGATIVE NEGATIVE   Leukocytes,Ua NEGATIVE NEGATIVE    Comment: Performed at [facility], [address], [place], [place] [address]  Urinalysis, Microscopic (reflex)     Status: None   Collection Time: 01/02/21 11:35 AM  Result Value Ref Range   RBC / HPF 0-5 0 - 5  RBC/hpf   WBC, UA 0-5 0 - 5 WBC/hpf   Bacteria, UA NONE SEEN NONE SEEN   Squamous Epithelial / LPF 0-5 0 - 5   Mucus PRESENT     Comment: Performed at [facility], [address] [place], [place] [address]  POC occult blood, ED     Status: None   Collection Time: 01/02/21  4:31 PM  Result Value Ref Range   Fecal Occult Bld NEGATIVE NEGATIVE  Resp Panel by RT-PCR (Flu A&B, Covid) Nasopharyngeal Swab     Status: None   Collection Time: 01/02/21  7:48 PM   Specimen: Nasopharyngeal Swab; Nasopharyngeal(NP) swabs in vial transport medium  Result Value Ref Range   SARS Coronavirus 2 by RT PCR NEGATIVE NEGATIVE    Comment: (NOTE) SARS-CoV-2 target nucleic acids are NOT DETECTED.  The SARS-CoV-2 RNA is generally detectable in upper respiratory specimens during the acute phase of infection. The lowest concentration of SARS-CoV-2 viral copies this assay can detect is 138 copies/mL. A negative result does not preclude SARS-Cov-2 infection and should not be used as the sole basis for treatment or other patient management decisions. A negative result may occur with  improper specimen collection/handling, submission of specimen other than nasopharyngeal swab, presence of viral mutation(s) within the areas targeted by this assay, and inadequate number of viral copies(<138 copies/mL). A negative result must be combined with clinical observations, patient history, and epidemiological information. The expected result is Negative.  Fact Sheet for Patients:  BloggerCourse.com  Fact Sheet for Healthcare Providers:  SeriousBroker.it  This test is no t yet approved or cleared by the [place] FDA and  has been authorized for detection and/or diagnosis of SARS-CoV-2 by FDA under an Emergency Use Authorization (EUA). This EUA will remain  in effect (meaning this test can be used) for the duration of the COVID-19 declaration under Section  564(b)(1) of the Act, 21 U.S.C.section 360bbb-3(b)(1), unless the authorization is terminated  or revoked sooner.       Influenza A by PCR NEGATIVE NEGATIVE   Influenza B by PCR NEGATIVE NEGATIVE    Comment: (NOTE) The Xpert Xpress SARS-CoV-2/FLU/RSV plus assay is intended as an aid in the diagnosis of influenza from Nasopharyngeal swab specimens and should not be used as a sole basis for treatment. Nasal washings and aspirates are unacceptable for Xpert Xpress SARS-CoV-2/FLU/RSV testing.  Fact  Sheet for Patients: BloggerCourse.com  Fact Sheet for Healthcare Providers: SeriousBroker.it  This test is not yet approved or cleared by the [place] FDA and has been authorized for detection and/or diagnosis of SARS-CoV-2 by FDA under an Emergency Use Authorization (EUA). This EUA will remain in effect (meaning this test can be used) for the duration of the COVID-19 declaration under Section 564(b)(1) of the Act, 21 U.S.C. section 360bbb-3(b)(1), unless the authorization is terminated or revoked.  Performed at [facility], [address], [place], [place] [address]   Protime-INR     Status: Abnormal   Collection Time: 01/03/21  2:46 AM  Result Value Ref Range   Prothrombin Time 15.8 (H) 11.4 - 15.2 seconds   INR 1.3 (H) 0.8 - 1.2    Comment: (NOTE) INR goal varies based on device and disease states. Performed at [facility], [address] [place], [place] [address]   Comprehensive metabolic panel     Status: Abnormal   Collection Time: 01/03/21  2:46 AM  Result Value Ref Range   Sodium 131 (L) 135 - 145 mmol/L   Potassium 3.6 3.5 - 5.1 mmol/L   Chloride 102 98 - 111 mmol/L   CO2 22 22 - 32 mmol/L   Glucose, Bld 112 (H) 70 - 99 mg/dL    Comment: Glucose reference range applies only to samples taken after fasting for at least 8 hours.   BUN 6 6 - 20 mg/dL   Creatinine, Ser 0.25 0.61 - 1.24 mg/dL   Calcium  8.5 (L) 8.9 - 10.3 mg/dL   Total Protein 7.9 6.5 - 8.1 g/dL   Albumin 3.0 (L) 3.5 - 5.0 g/dL   AST 99 (H) 15 - 41 U/L   ALT 45 (H) 0 - 44 U/L   Alkaline Phosphatase 117 38 - 126 U/L   Total Bilirubin 0.9 0.3 - 1.2 mg/dL   GFR, Estimated >42 >70 mL/min    Comment: (NOTE) Calculated using the CKD-EPI Creatinine Equation (2021)    Anion gap 7 5 - 15    Comment: Performed at [facility], [address], [place], [place] [address]    DG Orbits  Result Date: 01/03/2021 CLINICAL DATA:  Metal working/exposure; clearance prior to MRI EXAM: ORBITS - COMPLETE 4+ VIEW COMPARISON:  None. FINDINGS: There is no evidence of metallic foreign body within the orbits. No significant bone abnormality identified. IMPRESSION: No evidence of metallic foreign body within the orbits. Electronically Signed   By: Alcide Clever M.D.   On: 01/03/2021 01:54   CT ABDOMEN PELVIS W CONTRAST  Result Date: 01/02/2021 CLINICAL DATA:  Abdominal pain and fever. Smoker. Nurse triage notes describe rectal pain and lack of ability to have bowel movements. EXAM: CT ABDOMEN AND PELVIS WITH CONTRAST TECHNIQUE: Multidetector CT imaging of the abdomen and pelvis was performed using the standard protocol following bolus administration of intravenous contrast. CONTRAST:  OMNIPAQUE IOHEXOL 300 MG/ML  SOLN COMPARISON:  None. FINDINGS: Lower chest: Right base scarring. Right lower lobe 5 mm pulmonary nodule on 15/5. 4 mm left lower lobe pulmonary nodule. Normal heart size without pericardial or pleural effusion. Hepatobiliary: Hepatomegaly at 22.2 cm craniocaudal. Mild caudate lobe enlargement. Normal gallbladder, without biliary ductal dilatation. Pancreas: Normal, without mass or ductal dilatation. Spleen: Normal in size, without focal abnormality. Adrenals/Urinary Tract: Normal adrenal glands. Normal kidneys, without hydronephrosis. Normal urinary bladder. Stomach/Bowel: Normal stomach, without wall thickening. Normal appendix, and  terminal ileum. Normal small bowel.  Subtle hypoattenuation within the deep pelvis including at 2.9 by 2.0 cm on 90/3. Favored to be positioned along the posterior wall of the anus. Also sagittal image 93. Vascular/Lymphatic: Aortic atherosclerosis. No abdominopelvic adenopathy. Reproductive: Normal prostate. Other: No significant free fluid. Musculoskeletal: No acute osseous abnormality. IMPRESSION: 1. Subtle hypoattenuation within the deep pelvis, suspicious for perianal/perirectal infection and developing abscess. This area is sub optimally evaluated by CT. Recommend physical exam correlation. High-resolution MRI on a nonemergent basis would likely be of increased accuracy. 2. Hepatomegaly.  Mild caudate lobe enlargement is nonspecific. 3. Bibasilar pulmonary nodules. No follow-up needed if patient is low-risk. Non-contrast chest CT can be considered in 12 months if patient is high-risk. This recommendation follows the consensus statement: Guidelines for Management of Incidental Pulmonary Nodules Detected on CT Images: From the Fleischner Society 2017; Radiology 2017; 284:228-243. Electronically Signed   By: Jeronimo GreavesKyle  Talbot M.D.   On: 01/02/2021 18:51   US Abdomen Limited RUQ (LIVER/GB)  Result Date: 01/03/2021 CLINICAL DATA:  Elevated LFTs EXAM: ULTRASOUND ABDOMEN LIMITED RIGHT UPPER QUADRANT COMPARISON:  CT from the previous day. FINDINGS: Gallbladder: No gallstones or wall thickening visualized. No sonographic Murphy sign noted by sonographer. Common bile duct: Diameter: 3.1 mm. Liver: Slight increased echogenicity is noted consistent with fatty infiltration. No mass is seen. Portal vein is patent on color Doppler imaging with normal direction of blood flow towards the liver. Other: None. IMPRESSION: Fatty liver. No other focal abnormality is noted. Electronically Signed   By: Alcide CleverMark  Lukens M.D.   On: 01/03/2021 00:54    ROS 10 point review of systems is negative except as listed above in HPI.   Physical  Exam Blood pressure 98/60, pulse 99, temperature 99.1 F (37.3 C), temperature source Oral, resp. rate 19, height 5\' 6"  (1.676 m), weight 68 kg, SpO2 98 %. Constitutional: well-developed, well-nourished HEENT: pupils equal, round, reactive to light, 2mm b/l, moist conjunctiva, external inspection of ears and nose normal, hearing intact Oropharynx: normal oropharyngeal mucosa, normal dentition Neck: no thyromegaly, trachea midline, no midline cervical tenderness to palpation Chest: breath sounds equal bilaterally, normal respiratory effort, no midline or lateral chest wall tenderness to palpation/deformity Abdomen: soft, NT, no bruising, no hepatosplenomegaly GU: no blood at urethral meatus of penis, no scrotal masses or abnormality  Back: no wounds, no thoracic/lumbar spine tenderness to palpation, no thoracic/lumbar spine stepoffs Rectal: good tone, no blood, mild fullness at 12 o'clock without palpable fluid collection Extremities: 2+ radial and pedal pulses bilaterally, motor and sensation intact to bilateral UE and LE, no peripheral edema MSK: unable to assess gait/station, no clubbing/cyanosis of fingers/toes, normal ROM of all four extremities Skin: warm, dry, no rashes Psych: normal memory, normal mood/affect    Assessment/Plan: 72M with early perirectal inflammation. Mild stranding on MRI without definable fluid collection, but final report pending. Recommend continuing abx for now. Pain regimen adjusted.   Diamantina MonksAyesha N. Brayden Brodhead, MD General and Trauma Surgery [facility]

## 2021-01-03 NOTE — Anesthesia Postprocedure Evaluation (Signed)
Anesthesia Post Note  Patient: Trevor Dixon  Procedure(s) Performed: INCISION AND DRAINAGE ABSCESS EXAM UNDER ANESTHESIA     Patient location during evaluation: PACU Anesthesia Type: General Level of consciousness: awake and alert, patient cooperative and oriented Pain management: pain level controlled Vital Signs Assessment: post-procedure vital signs reviewed and stable Respiratory status: spontaneous breathing, nonlabored ventilation, respiratory function stable and patient connected to nasal cannula oxygen Cardiovascular status: blood pressure returned to baseline and stable Postop Assessment: no apparent nausea or vomiting Anesthetic complications: no   No notable events documented.  Last Vitals:  Vitals:   01/03/21 1954 01/03/21 2039  BP: 113/69   Pulse: 97   Resp: 17   Temp: 37.2 C   SpO2: (!) 89% 93%    Last Pain:  Vitals:   01/03/21 1945  TempSrc:   PainSc: 3                  Lila Lufkin,E. Sophiya Morello

## 2021-01-03 NOTE — Progress Notes (Signed)
[facility] PROGRESS NOTE    Progress Note  Trevor Dixon  NUU:[id number] DOB: Apr 29, 1981 DOA: 01/02/2021 PCP: System, Provider Not In     Brief Narrative:   Trevor Dixon is an 40 y.o. male no significant past medical history presents with constipation and right buttock pain CT scan of the abdomen and pelvis was concerning for perirectal abscess.  General surgery was consulted we will start empirically on I Rocephin and Flagyl.  Significant studies: 01/02/2021 CT scan of the abdomen and pelvis shows subtle hypoattenuation of the deep pelvis suspicious for perirectal infection or developing abscess. 01/03/2021 abdominal ultrasound showed fatty liver.  Assessment/Plan:   Sepsis probably secondary to perirectal abscess: MRI is pending at the time of this dictation.  Radioman MRI showed fluid collection in the rectal area with inflammatory stranding She was started empirically on IV Rocephin and Flagyl.  Elevate LFTs: Endorses drinking 24 ounces beer daily right upper quadrant ultrasound showed fatty liver no acute findings.    DVT prophylaxis: lovenox Family Communication:none Status is: Observation  The patient will require care spanning > 2 midnights and should be moved to inpatient because: Hemodynamically unstable  Dispo: The patient is from: Home              Anticipated d/c is to: Home              Patient currently is not medically stable to d/c.   Difficult to place patient No        Code Status:     Code Status Orders  (From admission, onward)           Start     Ordered   01/02/21 2217  Full code  Continuous        01/02/21 2218           Code Status History     This patient has a current code status but no historical code status.         IV Access:   Peripheral IV   Procedures and diagnostic studies:   DG Orbits  Result Date: 01/03/2021 CLINICAL DATA:  Metal working/exposure; clearance prior to MRI EXAM: ORBITS -  COMPLETE 4+ VIEW COMPARISON:  None. FINDINGS: There is no evidence of metallic foreign body within the orbits. No significant bone abnormality identified. IMPRESSION: No evidence of metallic foreign body within the orbits. Electronically Signed   By: Alcide Clever M.D.   On: 01/03/2021 01:54   CT ABDOMEN PELVIS W CONTRAST  Result Date: 01/02/2021 CLINICAL DATA:  Abdominal pain and fever. Smoker. Nurse triage notes describe rectal pain and lack of ability to have bowel movements. EXAM: CT ABDOMEN AND PELVIS WITH CONTRAST TECHNIQUE: Multidetector CT imaging of the abdomen and pelvis was performed using the standard protocol following bolus administration of intravenous contrast. CONTRAST:  OMNIPAQUE IOHEXOL 300 MG/ML  SOLN COMPARISON:  None. FINDINGS: Lower chest: Right base scarring. Right lower lobe 5 mm pulmonary nodule on 15/5. 4 mm left lower lobe pulmonary nodule. Normal heart size without pericardial or pleural effusion. Hepatobiliary: Hepatomegaly at 22.2 cm craniocaudal. Mild caudate lobe enlargement. Normal gallbladder, without biliary ductal dilatation. Pancreas: Normal, without mass or ductal dilatation. Spleen: Normal in size, without focal abnormality. Adrenals/Urinary Tract: Normal adrenal glands. Normal kidneys, without hydronephrosis. Normal urinary bladder. Stomach/Bowel: Normal stomach, without wall thickening. Normal appendix, and terminal ileum. Normal small bowel. Subtle hypoattenuation within the deep pelvis including at 2.9 by 2.0 cm on 90/3. Favored to be positioned along the  posterior wall of the anus. Also sagittal image 93. Vascular/Lymphatic: Aortic atherosclerosis. No abdominopelvic adenopathy. Reproductive: Normal prostate. Other: No significant free fluid. Musculoskeletal: No acute osseous abnormality. IMPRESSION: 1. Subtle hypoattenuation within the deep pelvis, suspicious for perianal/perirectal infection and developing abscess. This area is sub optimally evaluated by CT.  Recommend physical exam correlation. High-resolution MRI on a nonemergent basis would likely be of increased accuracy. 2. Hepatomegaly.  Mild caudate lobe enlargement is nonspecific. 3. Bibasilar pulmonary nodules. No follow-up needed if patient is low-risk. Non-contrast chest CT can be considered in 12 months if patient is high-risk. This recommendation follows the consensus statement: Guidelines for Management of Incidental Pulmonary Nodules Detected on CT Images: From the Fleischner Society 2017; Radiology 2017; 284:228-243. Electronically Signed   By: Jeronimo GreavesKyle  Talbot M.D.   On: 01/02/2021 18:51   US Abdomen Limited RUQ (LIVER/GB)  Result Date: 01/03/2021 CLINICAL DATA:  Elevated LFTs EXAM: ULTRASOUND ABDOMEN LIMITED RIGHT UPPER QUADRANT COMPARISON:  CT from the previous day. FINDINGS: Gallbladder: No gallstones or wall thickening visualized. No sonographic Murphy sign noted by sonographer. Common bile duct: Diameter: 3.1 mm. Liver: Slight increased echogenicity is noted consistent with fatty infiltration. No mass is seen. Portal vein is patent on color Doppler imaging with normal direction of blood flow towards the liver. Other: None. IMPRESSION: Fatty liver. No other focal abnormality is noted. Electronically Signed   By: Alcide CleverMark  Lukens M.D.   On: 01/03/2021 00:54     Medical Consultants:   None.   Subjective:    Trevor Dixon continues to have abdominal pain has not had a bowel movement.  Objective:    Vitals:   01/02/21 2245 01/02/21 2257 01/03/21 0013 01/03/21 0410  BP: 133/80  123/66 98/60  Pulse: (!) 101  (!) 101 99  Resp: 18  18 19   Temp:  100.1 F (37.8 C) 100.2 F (37.9 C) 99.1 F (37.3 C)  TempSrc:  Oral Oral Oral  SpO2: 100%  98% 98%  Weight:      Height:       SpO2: 98 %   Intake/Output Summary (Last 24 hours) at 01/03/2021 0709 Last data filed at 01/03/2021 0630 Gross per 24 hour  Intake 1477.95 ml  Output --  Net 1477.95 ml   Filed Weights   01/02/21 1134   Weight: 68 kg    Exam: General exam: In no acute distress. Respiratory system: Good air movement and clear to auscultation. Cardiovascular system: S1 & S2 heard, RRR. No JVD. Gastrointestinal system: Abdomen is nondistended, soft and nontender.  Extremities: No pedal edema. Skin: No rashes, lesions or ulcers  Data Reviewed:    Labs: Basic Metabolic Panel: Recent Labs  Lab 01/02/21 1135 01/03/21 0246  NA 133* 131*  K 4.3 3.6  CL 100 102  CO2 23 22  GLUCOSE 114* 112*  BUN <5* 6  CREATININE 0.78 0.72  CALCIUM 9.1 8.5*   GFR Estimated Creatinine Clearance: 110.8 mL/min (by C-G formula based on SCr of 0.72 mg/dL). Liver Function Tests: Recent Labs  Lab 01/02/21 1135 01/03/21 0246  AST 135* 99*  ALT 59* 45*  ALKPHOS 131* 117  BILITOT 1.1 0.9  PROT 8.6* 7.9  ALBUMIN 3.5 3.0*   Recent Labs  Lab 01/02/21 1135  LIPASE 35   No results for input(s): AMMONIA in the last 168 hours. Coagulation profile Recent Labs  Lab 01/03/21 0246  INR 1.3*   COVID-19 Labs  No results for input(s): DDIMER, FERRITIN, LDH, CRP in the last 72 hours.  Lab Results  Component Value Date   SARSCOV2NAA NEGATIVE 01/02/2021    CBC: Recent Labs  Lab 01/02/21 1135  WBC 18.5*  HGB 14.7  HCT 42.9  MCV 92.7  PLT 296   Cardiac Enzymes: No results for input(s): CKTOTAL, CKMB, CKMBINDEX, TROPONINI in the last 168 hours. BNP (last 3 results) No results for input(s): PROBNP in the last 8760 hours. CBG: No results for input(s): GLUCAP in the last 168 hours. D-Dimer: No results for input(s): DDIMER in the last 72 hours. Hgb A1c: No results for input(s): HGBA1C in the last 72 hours. Lipid Profile: No results for input(s): CHOL, HDL, LDLCALC, TRIG, CHOLHDL, LDLDIRECT in the last 72 hours. Thyroid function studies: No results for input(s): TSH, T4TOTAL, T3FREE, THYROIDAB in the last 72 hours.  Invalid input(s): FREET3 Anemia work up: No results for input(s): VITAMINB12, FOLATE,  FERRITIN, TIBC, IRON, RETICCTPCT in the last 72 hours. Sepsis Labs: Recent Labs  Lab 01/02/21 1135  WBC 18.5*   Microbiology Recent Results (from the past 240 hour(s))  Resp Panel by RT-PCR (Flu A&B, Covid) Nasopharyngeal Swab     Status: None   Collection Time: 01/02/21  7:48 PM   Specimen: Nasopharyngeal Swab; Nasopharyngeal(NP) swabs in vial transport medium  Result Value Ref Range Status   SARS Coronavirus 2 by RT PCR NEGATIVE NEGATIVE Final    Comment: (NOTE) SARS-CoV-2 target nucleic acids are NOT DETECTED.  The SARS-CoV-2 RNA is generally detectable in upper respiratory specimens during the acute phase of infection. The lowest concentration of SARS-CoV-2 viral copies this assay can detect is 138 copies/mL. A negative result does not preclude SARS-Cov-2 infection and should not be used as the sole basis for treatment or other patient management decisions. A negative result may occur with  improper specimen collection/handling, submission of specimen other than nasopharyngeal swab, presence of viral mutation(s) within the areas targeted by this assay, and inadequate number of viral copies(<138 copies/mL). A negative result must be combined with clinical observations, patient history, and epidemiological information. The expected result is Negative.  Fact Sheet for Patients:  BloggerCourse.com  Fact Sheet for Healthcare Providers:  SeriousBroker.it  This test is no t yet approved or cleared by the [place] FDA and  has been authorized for detection and/or diagnosis of SARS-CoV-2 by FDA under an Emergency Use Authorization (EUA). This EUA will remain  in effect (meaning this test can be used) for the duration of the COVID-19 declaration under Section 564(b)(1) of the Act, 21 U.S.C.section 360bbb-3(b)(1), unless the authorization is terminated  or revoked sooner.       Influenza A by PCR NEGATIVE NEGATIVE Final    Influenza B by PCR NEGATIVE NEGATIVE Final    Comment: (NOTE) The Xpert Xpress SARS-CoV-2/FLU/RSV plus assay is intended as an aid in the diagnosis of influenza from Nasopharyngeal swab specimens and should not be used as a sole basis for treatment. Nasal washings and aspirates are unacceptable for Xpert Xpress SARS-CoV-2/FLU/RSV testing.  Fact Sheet for Patients: BloggerCourse.com  Fact Sheet for Healthcare Providers: SeriousBroker.it  This test is not yet approved or cleared by the [place] FDA and has been authorized for detection and/or diagnosis of SARS-CoV-2 by FDA under an Emergency Use Authorization (EUA). This EUA will remain in effect (meaning this test can be used) for the duration of the COVID-19 declaration under Section 564(b)(1) of the Act, 21 U.S.C. section 360bbb-3(b)(1), unless the authorization is terminated or revoked.  Performed at [facility], [address]  [place], [place] [address]      Medications:    acetaminophen  1,000 mg Oral Q6H   ketorolac  30 mg Intravenous Q6H   Continuous Infusions:  sodium chloride 100 mL/hr at 01/03/21 0630   cefTRIAXone (ROCEPHIN)  IV Stopped (01/03/21 0310)   metronidazole Stopped (01/03/21 0420)      LOS: 0 days   Marinda Elk  [facility]  01/03/2021, 7:09 AM

## 2021-01-04 ENCOUNTER — Inpatient Hospital Stay (HOSPITAL_COMMUNITY): Payer: Self-pay

## 2021-01-04 ENCOUNTER — Encounter (HOSPITAL_COMMUNITY): Payer: Self-pay | Admitting: General Surgery

## 2021-01-04 DIAGNOSIS — J9601 Acute respiratory failure with hypoxia: Secondary | ICD-10-CM

## 2021-01-04 DIAGNOSIS — R042 Hemoptysis: Secondary | ICD-10-CM

## 2021-01-04 LAB — CBC
HCT: 35.8 % — ABNORMAL LOW (ref 39.0–52.0)
Hemoglobin: 12.2 g/dL — ABNORMAL LOW (ref 13.0–17.0)
MCH: 31.8 pg (ref 26.0–34.0)
MCHC: 34.1 g/dL (ref 30.0–36.0)
MCV: 93.2 fL (ref 80.0–100.0)
Platelets: 256 10*3/uL (ref 150–400)
RBC: 3.84 MIL/uL — ABNORMAL LOW (ref 4.22–5.81)
RDW: 12.8 % (ref 11.5–15.5)
WBC: 18.2 10*3/uL — ABNORMAL HIGH (ref 4.0–10.5)
nRBC: 0 % (ref 0.0–0.2)

## 2021-01-04 MED ORDER — MORPHINE SULFATE (PF) 2 MG/ML IV SOLN
2.0000 mg | INTRAVENOUS | Status: DC | PRN
  Administered 2021-01-04 – 2021-01-10 (×4): 2 mg via INTRAVENOUS
  Filled 2021-01-04 (×4): qty 1

## 2021-01-04 MED ORDER — PHENOL 1.4 % MT LIQD
1.0000 | OROMUCOSAL | Status: DC | PRN
  Administered 2021-01-04: 1 via OROMUCOSAL
  Filled 2021-01-04: qty 177

## 2021-01-04 MED ORDER — IOHEXOL 300 MG/ML  SOLN
100.0000 mL | Freq: Once | INTRAMUSCULAR | Status: AC | PRN
  Administered 2021-01-04: 100 mL via INTRAVENOUS

## 2021-01-04 MED ORDER — HYDROCOD POLST-CPM POLST ER 10-8 MG/5ML PO SUER
5.0000 mL | Freq: Three times a day (TID) | ORAL | Status: AC
  Administered 2021-01-04 – 2021-01-06 (×6): 5 mL via ORAL
  Filled 2021-01-04 (×6): qty 5

## 2021-01-04 MED ORDER — VANCOMYCIN HCL 1250 MG/250ML IV SOLN
1250.0000 mg | Freq: Two times a day (BID) | INTRAVENOUS | Status: DC
  Administered 2021-01-04: 1250 mg via INTRAVENOUS
  Filled 2021-01-04 (×3): qty 250

## 2021-01-04 MED ORDER — VITAMIN K1 10 MG/ML IJ SOLN
5.0000 mg | Freq: Once | INTRAVENOUS | Status: AC
  Administered 2021-01-04: 5 mg via INTRAVENOUS
  Filled 2021-01-04: qty 0.5

## 2021-01-04 MED ORDER — VANCOMYCIN HCL 1500 MG/300ML IV SOLN
1500.0000 mg | Freq: Once | INTRAVENOUS | Status: AC
  Administered 2021-01-04: 1500 mg via INTRAVENOUS
  Filled 2021-01-04: qty 300

## 2021-01-04 MED ORDER — GUAIFENESIN-DM 100-10 MG/5ML PO SYRP
5.0000 mL | ORAL_SOLUTION | ORAL | Status: DC | PRN
  Administered 2021-01-04 – 2021-01-13 (×11): 5 mL via ORAL
  Filled 2021-01-04 (×11): qty 5

## 2021-01-04 MED ORDER — OXYMETAZOLINE HCL 0.05 % NA SOLN
2.0000 | Freq: Three times a day (TID) | NASAL | Status: AC
  Administered 2021-01-04 (×3): 2 via NASAL
  Filled 2021-01-04: qty 30

## 2021-01-04 NOTE — Progress Notes (Signed)
Pharmacy Antibiotic Note  Trevor Dixon is a 40 y.o. male admitted on 01/02/2021 with perirectal abscess.  Pharmacy has been consulted for vancomycin dosing. 7/14 CT scan concerning for perirectal abscess. S/p I&D 7/15, WBC 18.2, afebrile  Plan: Start LD Vanc 1500 mg IV x1 dose followed by MD Vanc 1250 mg IV q12h  eAUC 530, Scr 0.8  Monitor renal fxn, micro data, vanc levels as indicated F/u LOT and cx results   Height: 5\' 6"  (167.6 cm) Weight: 68 kg (150 lb) IBW/kg (Calculated) : 63.8  Temp (24hrs), Avg:97.9 F (36.6 C), Min:97 F (36.1 C), Max:98.9 F (37.2 C)  Recent Labs  Lab 01/02/21 1135 01/03/21 0246 01/04/21 0826  WBC 18.5*  --  18.2*  CREATININE 0.78 0.72  --     Estimated Creatinine Clearance: 110.8 mL/min (by C-G formula based on SCr of 0.72 mg/dL).    No Known Allergies  Antimicrobials this admission: Zosyn 7/14 x1 Ceftriaxone 7/15 x1 Metronidazole 7/15 >>  Vancomycin 7/16 >>  Dose adjustments this admission:   Microbiology results: 7/15 AbscessCx:  7/15 MRSA PCR: positive  Thank you for allowing pharmacy to be a part of this patient's care.  8/15, PharmD 01/04/2021 9:28 AM

## 2021-01-04 NOTE — Consult Note (Signed)
Reason for Consult: Hemoptysis Referring Physician: Hospitalist  Joziyah Roblero is an 40 y.o. male.  HPI: History of I&D of a perirectal abscess and immediately afterwards he started having hemoptysis.  He is coughing up bright red blood fairly frequently.  He has some desaturation and requires O2 at this point.  He has had no bleeding from the mouth without coughing and no bleeding from the nose.  He had a slight crusting in the left side of the nose and I was called to evaluate for any upper airway source.  He has not had this previously.  No nasal obstruction.  Past Medical History:  Diagnosis Date   Fracture of fifth toe, left, open 03/17/2016    Past Surgical History:  Procedure Laterality Date   I & D EXTREMITY Left 03/20/2016   Procedure: IRRIGATION AND DEBRIDEMENT LEFT FOOT;  Surgeon: Tarry Kos, MD;  Location: [facility];  Service: Orthopedics;  Laterality: Left;  IRRIGATION AND DEBRIDEMENT LEFT FOOT   INCISION AND DRAINAGE ABSCESS N/A 01/03/2021   Procedure: INCISION AND DRAINAGE ABSCESS;  Surgeon: Emelia Loron, MD;  Location: [facility] OR;  Service: General;  Laterality: N/A;   NO PAST SURGERIES     RUPTURED GLOBE EXPLORATION AND REPAIR Right 07/14/2016   Procedure: REPAIR OF RUPTURED GLOBE;  Surgeon: Marcelline Deist, MD;  Location: [facility] OR;  Service: Ophthalmology;  Laterality: Right;    History reviewed. No pertinent family history.  Social History:  reports that he has been smoking. He has never used smokeless tobacco. He reports current alcohol use. He reports that he does not use drugs.  Allergies: No Known Allergies  Medications: I have reviewed the patient's current medications.  Results for orders placed or performed during the hospital encounter of 01/02/21 (from the past 48 hour(s))  Lipase, blood     Status: None   Collection Time: 01/02/21 11:35 AM  Result Value Ref Range   Lipase 35 11 - 51 U/L    Comment: Performed at [facility]  Lab, [address], [place], [place] [address]  Comprehensive metabolic panel     Status: Abnormal   Collection Time: 01/02/21 11:35 AM  Result Value Ref Range   Sodium 133 (L) 135 - 145 mmol/L   Potassium 4.3 3.5 - 5.1 mmol/L   Chloride 100 98 - 111 mmol/L   CO2 23 22 - 32 mmol/L   Glucose, Bld 114 (H) 70 - 99 mg/dL    Comment: Glucose reference range applies only to samples taken after fasting for at least 8 hours.   BUN <5 (L) 6 - 20 mg/dL   Creatinine, Ser 2.54 0.61 - 1.24 mg/dL   Calcium 9.1 8.9 - 27.0 mg/dL   Total Protein 8.6 (H) 6.5 - 8.1 g/dL   Albumin 3.5 3.5 - 5.0 g/dL   AST 623 (H) 15 - 41 U/L   ALT 59 (H) 0 - 44 U/L   Alkaline Phosphatase 131 (H) 38 - 126 U/L   Total Bilirubin 1.1 0.3 - 1.2 mg/dL   GFR, Estimated >76 >28 mL/min    Comment: (NOTE) Calculated using the CKD-EPI Creatinine Equation (2021)    Anion gap 10 5 - 15    Comment: Performed at [facility] Lab, [address], [place], [place] [address]  CBC     Status: Abnormal   Collection Time: 01/02/21 11:35 AM  Result Value Ref Range   WBC 18.5 (H) 4.0 - 10.5 K/uL   RBC 4.63 4.22 -  5.81 MIL/uL   Hemoglobin 14.7 13.0 - 17.0 g/dL   HCT 41.9 37.9 - 02.4 %   MCV 92.7 80.0 - 100.0 fL   MCH 31.7 26.0 - 34.0 pg   MCHC 34.3 30.0 - 36.0 g/dL   RDW 09.7 35.3 - 29.9 %   Platelets 296 150 - 400 K/uL   nRBC 0.0 0.0 - 0.2 %    Comment: Performed at [facility] Lab, [address] [place], [place] [address]  Urinalysis, Routine w reflex microscopic Urine, Clean Catch     Status: Abnormal   Collection Time: 01/02/21 11:35 AM  Result Value Ref Range   Color, Urine AMBER (A) YELLOW    Comment: BIOCHEMICALS MAY BE AFFECTED BY COLOR   APPearance CLEAR CLEAR   Specific Gravity, Urine 1.025 1.005 - 1.030   pH 6.0 5.0 - 8.0   Glucose, UA NEGATIVE NEGATIVE mg/dL   Hgb urine dipstick TRACE (A) NEGATIVE   Bilirubin Urine NEGATIVE NEGATIVE   Ketones, ur NEGATIVE NEGATIVE mg/dL   Protein, ur 30 (A) NEGATIVE mg/dL    Nitrite NEGATIVE NEGATIVE   Leukocytes,Ua NEGATIVE NEGATIVE    Comment: Performed at [facility] Lab, [address] [place], [place] [address]  Urinalysis, Microscopic (reflex)     Status: None   Collection Time: 01/02/21 11:35 AM  Result Value Ref Range   RBC / HPF 0-5 0 - 5 RBC/hpf   WBC, UA 0-5 0 - 5 WBC/hpf   Bacteria, UA NONE SEEN NONE SEEN   Squamous Epithelial / LPF 0-5 0 - 5   Mucus PRESENT     Comment: Performed at [facility], [address], [place], [place] [address]  POC occult blood, ED     Status: None   Collection Time: 01/02/21  4:31 PM  Result Value Ref Range   Fecal Occult Bld NEGATIVE NEGATIVE  Resp Panel by RT-PCR (Flu A&B, Covid) Nasopharyngeal Swab     Status: None   Collection Time: 01/02/21  7:48 PM   Specimen: Nasopharyngeal Swab; Nasopharyngeal(NP) swabs in vial transport medium  Result Value Ref Range   SARS Coronavirus 2 by RT PCR NEGATIVE NEGATIVE    Comment: (NOTE) SARS-CoV-2 target nucleic acids are NOT DETECTED.  The SARS-CoV-2 RNA is generally detectable in upper respiratory specimens during the acute phase of infection. The lowest concentration of SARS-CoV-2 viral copies this assay can detect is 138 copies/mL. A negative result does not preclude SARS-Cov-2 infection and should not be used as the sole basis for treatment or other patient management decisions. A negative result may occur with  improper specimen collection/handling, submission of specimen other than nasopharyngeal swab, presence of viral mutation(s) within the areas targeted by this assay, and inadequate number of viral copies(<138 copies/mL). A negative result must be combined with clinical observations, patient history, and epidemiological information. The expected result is Negative.  Fact Sheet for Patients:  BloggerCourse.com  Fact Sheet for Healthcare Providers:  SeriousBroker.it  This test is no t yet  approved or cleared by the [place] FDA and  has been authorized for detection and/or diagnosis of SARS-CoV-2 by FDA under an Emergency Use Authorization (EUA). This EUA will remain  in effect (meaning this test can be used) for the duration of the COVID-19 declaration under Section 564(b)(1) of the Act, 21 U.S.C.section 360bbb-3(b)(1), unless the authorization is terminated  or revoked sooner.       Influenza A by PCR NEGATIVE NEGATIVE   Influenza B by  PCR NEGATIVE NEGATIVE    Comment: (NOTE) The Xpert Xpress SARS-CoV-2/FLU/RSV plus assay is intended as an aid in the diagnosis of influenza from Nasopharyngeal swab specimens and should not be used as a sole basis for treatment. Nasal washings and aspirates are unacceptable for Xpert Xpress SARS-CoV-2/FLU/RSV testing.  Fact Sheet for Patients: BloggerCourse.com  Fact Sheet for Healthcare Providers: SeriousBroker.it  This test is not yet approved or cleared by the [place] FDA and has been authorized for detection and/or diagnosis of SARS-CoV-2 by FDA under an Emergency Use Authorization (EUA). This EUA will remain in effect (meaning this test can be used) for the duration of the COVID-19 declaration under Section 564(b)(1) of the Act, 21 U.S.C. section 360bbb-3(b)(1), unless the authorization is terminated or revoked.  Performed at [facility] Lab, [address], [place], [place] [address]   HIV Antibody (routine testing w rflx)     Status: None   Collection Time: 01/03/21  2:46 AM  Result Value Ref Range   HIV Screen 4th Generation wRfx Non Reactive Non Reactive    Comment: Performed at [facility] Lab, [address] [place], [place] [address]  Protime-INR     Status: Abnormal   Collection Time: 01/03/21  2:46 AM  Result Value Ref Range   Prothrombin Time 15.8 (H) 11.4 - 15.2 seconds   INR 1.3 (H) 0.8 - 1.2    Comment: (NOTE) INR goal varies based on  device and disease states. Performed at [facility] Lab, [address] [place], [place] [address]   Comprehensive metabolic panel     Status: Abnormal   Collection Time: 01/03/21  2:46 AM  Result Value Ref Range   Sodium 131 (L) 135 - 145 mmol/L   Potassium 3.6 3.5 - 5.1 mmol/L   Chloride 102 98 - 111 mmol/L   CO2 22 22 - 32 mmol/L   Glucose, Bld 112 (H) 70 - 99 mg/dL    Comment: Glucose reference range applies only to samples taken after fasting for at least 8 hours.   BUN 6 6 - 20 mg/dL   Creatinine, Ser 2.84 0.61 - 1.24 mg/dL   Calcium 8.5 (L) 8.9 - 10.3 mg/dL   Total Protein 7.9 6.5 - 8.1 g/dL   Albumin 3.0 (L) 3.5 - 5.0 g/dL   AST 99 (H) 15 - 41 U/L   ALT 45 (H) 0 - 44 U/L   Alkaline Phosphatase 117 38 - 126 U/L   Total Bilirubin 0.9 0.3 - 1.2 mg/dL   GFR, Estimated >13 >24 mL/min    Comment: (NOTE) Calculated using the CKD-EPI Creatinine Equation (2021)    Anion gap 7 5 - 15    Comment: Performed at [facility] Lab, [address] [place], [place] [address]  Surgical pcr screen     Status: Abnormal   Collection Time: 01/03/21  5:38 AM   Specimen: Nasal Mucosa; Nasal Swab  Result Value Ref Range   MRSA, PCR NEGATIVE NEGATIVE   Staphylococcus aureus POSITIVE (A) NEGATIVE    Comment: (NOTE) The Xpert SA Assay (FDA approved for NASAL specimens in patients 69 years of age and older), is one component of a comprehensive surveillance program. It is not intended to diagnose infection nor to guide or monitor treatment. Performed at [facility] Lab, [address], [place], [place] [address]   CBC     Status: Abnormal   Collection Time: 01/04/21  8:26 AM  Result Value Ref Range  WBC 18.2 (H) 4.0 - 10.5 K/uL   RBC 3.84 (L) 4.22 - 5.81 MIL/uL   Hemoglobin 12.2 (L) 13.0 - 17.0 g/dL   HCT 20.9 (L) 47.0 - 96.2 %   MCV 93.2 80.0 - 100.0 fL   MCH 31.8 26.0 - 34.0 pg   MCHC 34.1 30.0 - 36.0 g/dL   RDW 83.6 62.9 - 47.6 %   Platelets 256 150 - 400 K/uL   nRBC 0.0  0.0 - 0.2 %    Comment: Performed at [facility] Lab, [address], [place], [place] [address]    DG Orbits  Result Date: 01/03/2021 CLINICAL DATA:  Metal working/exposure; clearance prior to MRI EXAM: ORBITS - COMPLETE 4+ VIEW COMPARISON:  None. FINDINGS: There is no evidence of metallic foreign body within the orbits. No significant bone abnormality identified. IMPRESSION: No evidence of metallic foreign body within the orbits. Electronically Signed   By: Alcide Clever M.D.   On: 01/03/2021 01:54   MR PELVIS W WO CONTRAST  Result Date: 01/03/2021 CLINICAL DATA:  Abdominal abscess constipation rectal EXAM: MRI PELVIS WITHOUT AND WITH CONTRAST TECHNIQUE: Multiplanar multisequence MR imaging of the pelvis was performed both before and after administration of intravenous contrast. CONTRAST:  16mL GADAVIST GADOBUTROL 1 MMOL/ML IV SOLN COMPARISON:  CT abdomen pelvis 01/02/2021 FINDINGS: Urinary Tract:  No abnormality visualized. Bowel: There is a rim enhancing perianal abscess posterior to the anal canal, 6 o'clock face in lithotomy position, measuring approximately 3.8 x 3.4 x 1.9 cm (series 8, image 30, series 3, image 36). Otherwise unremarkable visualized pelvic bowel loops. Vascular/Lymphatic: Small, prominent, reactive perirectal lymph nodes (series 9, image 18). No significant vascular abnormality seen. Reproductive:  No mass or other significant abnormality Other:  None. Musculoskeletal: No suspicious bone lesions identified. IMPRESSION: 1. Rim enhancing perianal abscess posterior to the anal canal, 6 o'clock face in lithotomy position, measuring approximately 3.8 x 3.4 x 1.9 cm. 2. Small, prominent, reactive perirectal lymph nodes. Preliminary findings were documented by Dr. Gwenyth Bender. Electronically Signed   By: Lauralyn Primes M.D.   On: 01/03/2021 08:19   CT ABDOMEN PELVIS W CONTRAST  Result Date: 01/02/2021 CLINICAL DATA:  Abdominal pain and fever. Smoker. Nurse triage notes describe rectal  pain and lack of ability to have bowel movements. EXAM: CT ABDOMEN AND PELVIS WITH CONTRAST TECHNIQUE: Multidetector CT imaging of the abdomen and pelvis was performed using the standard protocol following bolus administration of intravenous contrast. CONTRAST:  OMNIPAQUE IOHEXOL 300 MG/ML  SOLN COMPARISON:  None. FINDINGS: Lower chest: Right base scarring. Right lower lobe 5 mm pulmonary nodule on 15/5. 4 mm left lower lobe pulmonary nodule. Normal heart size without pericardial or pleural effusion. Hepatobiliary: Hepatomegaly at 22.2 cm craniocaudal. Mild caudate lobe enlargement. Normal gallbladder, without biliary ductal dilatation. Pancreas: Normal, without mass or ductal dilatation. Spleen: Normal in size, without focal abnormality. Adrenals/Urinary Tract: Normal adrenal glands. Normal kidneys, without hydronephrosis. Normal urinary bladder. Stomach/Bowel: Normal stomach, without wall thickening. Normal appendix, and terminal ileum. Normal small bowel. Subtle hypoattenuation within the deep pelvis including at 2.9 by 2.0 cm on 90/3. Favored to be positioned along the posterior wall of the anus. Also sagittal image 93. Vascular/Lymphatic: Aortic atherosclerosis. No abdominopelvic adenopathy. Reproductive: Normal prostate. Other: No significant free fluid. Musculoskeletal: No acute osseous abnormality. IMPRESSION: 1. Subtle hypoattenuation within the deep pelvis, suspicious for perianal/perirectal infection and developing abscess. This area is sub optimally evaluated by CT. Recommend physical exam correlation. High-resolution MRI on a nonemergent  basis would likely be of increased accuracy. 2. Hepatomegaly.  Mild caudate lobe enlargement is nonspecific. 3. Bibasilar pulmonary nodules. No follow-up needed if patient is low-risk. Non-contrast chest CT can be considered in 12 months if patient is high-risk. This recommendation follows the consensus statement: Guidelines for Management of Incidental Pulmonary  Nodules Detected on CT Images: From the Fleischner Society 2017; Radiology 2017; 284:228-243. Electronically Signed   By: Jeronimo GreavesKyle  Talbot M.D.   On: 01/02/2021 18:51   DG CHEST PORT 1 VIEW  Result Date: 01/04/2021 CLINICAL DATA:  Respiratory failure EXAM: PORTABLE CHEST 1 VIEW COMPARISON:  01/04/2021 FINDINGS: Diffuse bilateral airspace disease . Normal cardiac silhouette. No pleural fluid. No pneumothorax. IMPRESSION: Diffuse bilateral airspace disease consistent with multifocal pneumonia versus pulmonary edema. Electronically Signed   By: Genevive BiStewart  Edmunds M.D.   On: 01/04/2021 11:12   US Abdomen Limited RUQ (LIVER/GB)  Result Date: 01/03/2021 CLINICAL DATA:  Elevated LFTs EXAM: ULTRASOUND ABDOMEN LIMITED RIGHT UPPER QUADRANT COMPARISON:  CT from the previous day. FINDINGS: Gallbladder: No gallstones or wall thickening visualized. No sonographic Murphy sign noted by sonographer. Common bile duct: Diameter: 3.1 mm. Liver: Slight increased echogenicity is noted consistent with fatty infiltration. No mass is seen. Portal vein is patent on color Doppler imaging with normal direction of blood flow towards the liver. Other: None. IMPRESSION: Fatty liver. No other focal abnormality is noted. Electronically Signed   By: Alcide CleverMark  Lukens M.D.   On: 01/03/2021 00:54    ROS Blood pressure 99/64, pulse 95, temperature 98.1 F (36.7 C), temperature source Oral, resp. rate 20, height 5\' 6"  (1.676 m), weight 68 kg, SpO2 (!) 89 %. Physical Exam HENT:     Head: Normocephalic and atraumatic.     Nose:     Comments: Fiberoptic scope reveals no evidence of any bleeding in the nose on either side.  There is no crusting or blood clots.  The nasopharynx is clear.  The oral cavity by direct examination with the headlight as well as the fiberoptic scope does not reveal any lesions, scratches, or evidence of source of bleeding.  Both tonsils are about +2 without evidence of issues.  The hypopharynx and larynx are without evidence  of any staining or clotting.  There is no blood running down from above into the larynx area.  The base of tongue and epiglottis look normal.  Both vocal cords move normally.  I do not see any lesions in the subglottis the best I can examine.    Mouth/Throat:     Mouth: Mucous membranes are moist.     Pharynx: Oropharynx is clear.  Neurological:     Mental Status: He is alert.      Assessment/Plan: Hemoptysis-I do not see any evidence of blood in the nasal cavity, oral cavity, oropharynx, hypopharynx, or larynx.  This would seem as though it is coming from the lungs as he actively coughed up bright red blood multiple times during my discussion.  All the discussion was held by the interpreter on line.  I have discussed the case with pulmonary and they will likely proceed with a bronchoscopy.  Suzanna ObeyJohn Jacari Iannello 01/04/2021, 11:21 AM

## 2021-01-04 NOTE — Progress Notes (Addendum)
[facility] PROGRESS NOTE    Progress Note  Jonerik Sliker  GLO:[id number] DOB: 01-Mar-1981 DOA: 01/02/2021 PCP: System, Provider Not In     Brief Narrative:   Trevor Dixon is an 40 y.o. male no significant past medical history presents with constipation and right buttock pain CT scan of the abdomen and pelvis was concerning for perirectal abscess.  General surgery was consulted we will start empirically on I Rocephin and Flagyl.  Significant studies: 01/02/2021 CT scan of the abdomen and pelvis shows subtle hypoattenuation of the deep pelvis suspicious for perirectal infection or developing abscess. 01/03/2021 abdominal ultrasound showed fatty liver.  Assessment/Plan:   Sepsis probably secondary to perirectal abscess: Status post I&D on 01/04/2021, by general surgery appreciate assistance. Continue IV Rocephin and Flagyl.  His PCR for staph was positive was started empirically on IV vancomycin. Has remained afebrile CBC is pending this morning.  Elevate LFTs: Endorses drinking 24 ounces beer daily right upper quadrant ultrasound showed fatty liver no acute findings.  New hemoptysis: Check a CBC stat, he is requiring 3 L of oxygen to keep saturations greater than 93%. Check a CT of the chest rule out traumatic perforation, place him n.p.o. Is currently on IV Rocephin and Flagyl.  Start IV Vanc. PT and INR 1.3 platelet yesterday were 296.  HIV negative.    DVT prophylaxis: lovenox Family Communication:none Status is: Observation  The patient will require care spanning > 2 midnights and should be moved to inpatient because: Hemodynamically unstable  Dispo: The patient is from: Home              Anticipated d/c is to: Home              Patient currently is not medically stable to d/c.   Difficult to place patient No   Code Status:     Code Status Orders  (From admission, onward)           Start     Ordered   01/02/21 2217  Full code  Continuous         01/02/21 2218           Code Status History     This patient has a current code status but no historical code status.         IV Access:   Peripheral IV   Procedures and diagnostic studies:   DG Orbits  Result Date: 01/03/2021 CLINICAL DATA:  Metal working/exposure; clearance prior to MRI EXAM: ORBITS - COMPLETE 4+ VIEW COMPARISON:  None. FINDINGS: There is no evidence of metallic foreign body within the orbits. No significant bone abnormality identified. IMPRESSION: No evidence of metallic foreign body within the orbits. Electronically Signed   By: Alcide Clever M.D.   On: 01/03/2021 01:54   MR PELVIS W WO CONTRAST  Result Date: 01/03/2021 CLINICAL DATA:  Abdominal abscess constipation rectal EXAM: MRI PELVIS WITHOUT AND WITH CONTRAST TECHNIQUE: Multiplanar multisequence MR imaging of the pelvis was performed both before and after administration of intravenous contrast. CONTRAST:  26mL GADAVIST GADOBUTROL 1 MMOL/ML IV SOLN COMPARISON:  CT abdomen pelvis 01/02/2021 FINDINGS: Urinary Tract:  No abnormality visualized. Bowel: There is a rim enhancing perianal abscess posterior to the anal canal, 6 o'clock face in lithotomy position, measuring approximately 3.8 x 3.4 x 1.9 cm (series 8, image 30, series 3, image 36). Otherwise unremarkable visualized pelvic bowel loops. Vascular/Lymphatic: Small, prominent, reactive perirectal lymph nodes (series 9, image 18). No significant vascular abnormality seen. Reproductive:  No mass or other significant abnormality Other:  None. Musculoskeletal: No suspicious bone lesions identified. IMPRESSION: 1. Rim enhancing perianal abscess posterior to the anal canal, 6 o'clock face in lithotomy position, measuring approximately 3.8 x 3.4 x 1.9 cm. 2. Small, prominent, reactive perirectal lymph nodes. Preliminary findings were documented by Dr. Gwenyth Benderadparvar. Electronically Signed   By: Lauralyn PrimesAlex  Bibbey M.D.   On: 01/03/2021 08:19   CT ABDOMEN PELVIS W  CONTRAST  Result Date: 01/02/2021 CLINICAL DATA:  Abdominal pain and fever. Smoker. Nurse triage notes describe rectal pain and lack of ability to have bowel movements. EXAM: CT ABDOMEN AND PELVIS WITH CONTRAST TECHNIQUE: Multidetector CT imaging of the abdomen and pelvis was performed using the standard protocol following bolus administration of intravenous contrast. CONTRAST:  100mL OMNIPAQUE IOHEXOL 300 MG/ML  SOLN COMPARISON:  None. FINDINGS: Lower chest: Right base scarring. Right lower lobe 5 mm pulmonary nodule on 15/5. 4 mm left lower lobe pulmonary nodule. Normal heart size without pericardial or pleural effusion. Hepatobiliary: Hepatomegaly at 22.2 cm craniocaudal. Mild caudate lobe enlargement. Normal gallbladder, without biliary ductal dilatation. Pancreas: Normal, without mass or ductal dilatation. Spleen: Normal in size, without focal abnormality. Adrenals/Urinary Tract: Normal adrenal glands. Normal kidneys, without hydronephrosis. Normal urinary bladder. Stomach/Bowel: Normal stomach, without wall thickening. Normal appendix, and terminal ileum. Normal small bowel. Subtle hypoattenuation within the deep pelvis including at 2.9 by 2.0 cm on 90/3. Favored to be positioned along the posterior wall of the anus. Also sagittal image 93. Vascular/Lymphatic: Aortic atherosclerosis. No abdominopelvic adenopathy. Reproductive: Normal prostate. Other: No significant free fluid. Musculoskeletal: No acute osseous abnormality. IMPRESSION: 1. Subtle hypoattenuation within the deep pelvis, suspicious for perianal/perirectal infection and developing abscess. This area is sub optimally evaluated by CT. Recommend physical exam correlation. High-resolution MRI on a nonemergent basis would likely be of increased accuracy. 2. Hepatomegaly.  Mild caudate lobe enlargement is nonspecific. 3. Bibasilar pulmonary nodules. No follow-up needed if patient is low-risk. Non-contrast chest CT can be considered in 12 months if  patient is high-risk. This recommendation follows the consensus statement: Guidelines for Management of Incidental Pulmonary Nodules Detected on CT Images: From the Fleischner Society 2017; Radiology 2017; 284:228-243. Electronically Signed   By: Jeronimo GreavesKyle  Talbot M.D.   On: 01/02/2021 18:51   US Abdomen Limited RUQ (LIVER/GB)  Result Date: 01/03/2021 CLINICAL DATA:  Elevated LFTs EXAM: ULTRASOUND ABDOMEN LIMITED RIGHT UPPER QUADRANT COMPARISON:  CT from the previous day. FINDINGS: Gallbladder: No gallstones or wall thickening visualized. No sonographic Murphy sign noted by sonographer. Common bile duct: Diameter: 3.1 mm. Liver: Slight increased echogenicity is noted consistent with fatty infiltration. No mass is seen. Portal vein is patent on color Doppler imaging with normal direction of blood flow towards the liver. Other: None. IMPRESSION: Fatty liver. No other focal abnormality is noted. Electronically Signed   By: Alcide CleverMark  Lukens M.D.   On: 01/03/2021 00:54     Medical Consultants:   None.   Subjective:    Maxine GlennFrancisco Menton relates he started having hemoptysis right after surgical procedure.  Objective:    Vitals:   01/03/21 2329 01/04/21 0019 01/04/21 0539 01/04/21 0613  BP: 115/66  104/66 110/68  Pulse: 100 92 78 97  Resp: 18  20 17   Temp: 97.8 F (36.6 C)  97.8 F (36.6 C) (!) 97.5 F (36.4 C)  TempSrc: Oral  Oral Oral  SpO2: 90% 93% 95% 95%  Weight:      Height:       SpO2: 95 % (  retake) O2 Flow Rate (L/min): 3 L/min   Intake/Output Summary (Last 24 hours) at 01/04/2021 0833 Last data filed at 01/04/2021 0548 Gross per 24 hour  Intake 1660 ml  Output --  Net 1660 ml    Filed Weights   01/02/21 1134  Weight: 68 kg    Exam: General exam: In no acute distress. Respiratory system: Good air movement and crackles on the right and left lower lobe Cardiovascular system: S1 & S2 heard, RRR. No JVD. Gastrointestinal system: Abdomen is nondistended, soft and nontender.   Extremities: No pedal edema. Skin: No rashes, lesions or ulcers  Data Reviewed:    Labs: Basic Metabolic Panel: Recent Labs  Lab 01/02/21 1135 01/03/21 0246  NA 133* 131*  K 4.3 3.6  CL 100 102  CO2 23 22  GLUCOSE 114* 112*  BUN <5* 6  CREATININE 0.78 0.72  CALCIUM 9.1 8.5*    GFR Estimated Creatinine Clearance: 110.8 mL/min (by C-G formula based on SCr of 0.72 mg/dL). Liver Function Tests: Recent Labs  Lab 01/02/21 1135 01/03/21 0246  AST 135* 99*  ALT 59* 45*  ALKPHOS 131* 117  BILITOT 1.1 0.9  PROT 8.6* 7.9  ALBUMIN 3.5 3.0*    Recent Labs  Lab 01/02/21 1135  LIPASE 35    No results for input(s): AMMONIA in the last 168 hours. Coagulation profile Recent Labs  Lab 01/03/21 0246  INR 1.3*    COVID-19 Labs  No results for input(s): DDIMER, FERRITIN, LDH, CRP in the last 72 hours.  Lab Results  Component Value Date   SARSCOV2NAA NEGATIVE 01/02/2021    CBC: Recent Labs  Lab 01/02/21 1135  WBC 18.5*  HGB 14.7  HCT 42.9  MCV 92.7  PLT 296    Cardiac Enzymes: No results for input(s): CKTOTAL, CKMB, CKMBINDEX, TROPONINI in the last 168 hours. BNP (last 3 results) No results for input(s): PROBNP in the last 8760 hours. CBG: No results for input(s): GLUCAP in the last 168 hours. D-Dimer: No results for input(s): DDIMER in the last 72 hours. Hgb A1c: No results for input(s): HGBA1C in the last 72 hours. Lipid Profile: No results for input(s): CHOL, HDL, LDLCALC, TRIG, CHOLHDL, LDLDIRECT in the last 72 hours. Thyroid function studies: No results for input(s): TSH, T4TOTAL, T3FREE, THYROIDAB in the last 72 hours.  Invalid input(s): FREET3 Anemia work up: No results for input(s): VITAMINB12, FOLATE, FERRITIN, TIBC, IRON, RETICCTPCT in the last 72 hours. Sepsis Labs: Recent Labs  Lab 01/02/21 1135  WBC 18.5*    Microbiology Recent Results (from the past 240 hour(s))  Resp Panel by RT-PCR (Flu A&B, Covid) Nasopharyngeal Swab      Status: None   Collection Time: 01/02/21  7:48 PM   Specimen: Nasopharyngeal Swab; Nasopharyngeal(NP) swabs in vial transport medium  Result Value Ref Range Status   SARS Coronavirus 2 by RT PCR NEGATIVE NEGATIVE Final    Comment: (NOTE) SARS-CoV-2 target nucleic acids are NOT DETECTED.  The SARS-CoV-2 RNA is generally detectable in upper respiratory specimens during the acute phase of infection. The lowest concentration of SARS-CoV-2 viral copies this assay can detect is 138 copies/mL. A negative result does not preclude SARS-Cov-2 infection and should not be used as the sole basis for treatment or other patient management decisions. A negative result may occur with  improper specimen collection/handling, submission of specimen other than nasopharyngeal swab, presence of viral mutation(s) within the areas targeted by this assay, and inadequate number of viral copies(<138 copies/mL). A negative result  must be combined with clinical observations, patient history, and epidemiological information. The expected result is Negative.  Fact Sheet for Patients:  BloggerCourse.com  Fact Sheet for Healthcare Providers:  SeriousBroker.it  This test is no t yet approved or cleared by the [place] FDA and  has been authorized for detection and/or diagnosis of SARS-CoV-2 by FDA under an Emergency Use Authorization (EUA). This EUA will remain  in effect (meaning this test can be used) for the duration of the COVID-19 declaration under Section 564(b)(1) of the Act, 21 U.S.C.section 360bbb-3(b)(1), unless the authorization is terminated  or revoked sooner.       Influenza A by PCR NEGATIVE NEGATIVE Final   Influenza B by PCR NEGATIVE NEGATIVE Final    Comment: (NOTE) The Xpert Xpress SARS-CoV-2/FLU/RSV plus assay is intended as an aid in the diagnosis of influenza from Nasopharyngeal swab specimens and should not be used as a sole basis  for treatment. Nasal washings and aspirates are unacceptable for Xpert Xpress SARS-CoV-2/FLU/RSV testing.  Fact Sheet for Patients: BloggerCourse.com  Fact Sheet for Healthcare Providers: SeriousBroker.it  This test is not yet approved or cleared by the [place] FDA and has been authorized for detection and/or diagnosis of SARS-CoV-2 by FDA under an Emergency Use Authorization (EUA). This EUA will remain in effect (meaning this test can be used) for the duration of the COVID-19 declaration under Section 564(b)(1) of the Act, 21 U.S.C. section 360bbb-3(b)(1), unless the authorization is terminated or revoked.  Performed at [facility], [address], [place], [place] [address]   Surgical pcr screen     Status: Abnormal   Collection Time: 01/03/21  5:38 AM   Specimen: Nasal Mucosa; Nasal Swab  Result Value Ref Range Status   MRSA, PCR NEGATIVE NEGATIVE Final   Staphylococcus aureus POSITIVE (A) NEGATIVE Final    Comment: (NOTE) The Xpert SA Assay (FDA approved for NASAL specimens in patients 40 years of age and older), is one component of a comprehensive surveillance program. It is not intended to diagnose infection nor to guide or monitor treatment. Performed at [facility], [address] [place], [place] [address]      Medications:    acetaminophen  1,000 mg Oral Q6H   Chlorhexidine Gluconate Cloth  6 each Topical Q0600   ketorolac  30 mg Intravenous Q6H   mupirocin ointment  1 application Nasal BID   Continuous Infusions:  cefTRIAXone (ROCEPHIN)  IV Stopped (01/04/21 0004)   metronidazole 500 mg (01/04/21 0759)      LOS: 1 day   Marinda Elk  [facility]  01/04/2021, 8:33 AM

## 2021-01-04 NOTE — Consult Note (Addendum)
Name: Trevor Dixon MRN: [id number] DOB: 1980-12-04    ADMISSION DATE:  01/02/2021 CONSULTATION DATE:  01/04/2021   REFERRING MD :  Darin Engels TRH  CHIEF COMPLAINT: Hemoptysis  BRIEF PATIENT DESCRIPTION: 40 year old never smoker admitted with perirectal abscess status post I&D, developed new onset hemoptysis and hypoxia  SIGNIFICANT EVENTS  7/15 I&D of perianal abscess  STUDIES:  CT abdomen/pelvis 7/14 2.9 cm perianal abscess , right lower lobe pulmonary nodule and 4 mm left lower lobe nodule   HISTORY OF PRESENT ILLNESS: 40 year old never smoker, Spanish-speaking male, admitted 7/15 with constipation and right buttock pain making it difficult to walk with subjective fevers. Febrile 101 on admit mildly abnormal LFTs, WBC of 18.5, CT abdomen/pelvis showed perianal deep pelvic abscess.  Given empiric antibiotics and underwent I&D of abscess under general anesthesia, procedure uneventful , grade 1 airway noted by anesthesia and ETT to obtain on first attempt.  He developed hemoptysis, reports started right after surgical procedure, coughing all night, bringing up blood, he shows me several tissues with blood on them, has required increasing oxygen by nasal cannula to 5 L currently to maintain saturation 90% and above. Denies smoking or drug use, admits to drinking 3 beers daily Has never had withdrawal. HIV negative  PAST MEDICAL HISTORY :   has a past medical history of Fracture of fifth toe, left, open (03/17/2016).  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 Prior to Admission medications   Medication Sig Start Date End Date Taking? Authorizing Provider  HYDROcodone-acetaminophen (NORCO) 7.5-325 MG tablet Take 1-2 tablets by mouth every 6 (six) hours as needed for moderate pain. Patient not taking: No sig reported 03/20/16    Tarry Kos, MD  ondansetron (ZOFRAN) 4 MG tablet Take 1-2 tablets (4-8 mg total) by mouth every 8 (eight) hours as needed for nausea or vomiting. Patient not taking: No sig reported 03/20/16   Tarry Kos, MD  oxyCODONE-acetaminophen (PERCOCET/ROXICET) 5-325 MG tablet Take 1-2 tablets by mouth every 6 (six) hours as needed for moderate pain or severe pain. Patient not taking: No sig reported 03/18/16   Fayrene Helper, PA-C  senna-docusate (SENOKOT S) 8.6-50 MG tablet Take 1 tablet by mouth at bedtime as needed. Patient not taking: No sig reported 03/20/16   Tarry Kos, MD  traMADol (ULTRAM) 50 MG tablet Take 1 tablet (50 mg total) by mouth every 6 (six) hours as needed. Patient not taking: No sig reported 02/21/17   Geoffery Lyons, MD   No Known Allergies  FAMILY HISTORY:  family history is not on file. SOCIAL HISTORY:  reports that he has been smoking. He has never used smokeless tobacco. He reports current alcohol use. He reports that he does not use drugs.  REVIEW OF SYSTEMS:   Complains of pain in buttocks Hemoptysis  Constitutional: Negative for fever, chills, weight loss, malaise/fatigue and diaphoresis.  HENT: Negative for hearing loss, ear pain, nosebleeds, congestion, sore throat, neck pain, tinnitus and ear discharge.   Eyes: Negative for blurred vision, double vision, photophobia, pain, discharge and redness.  Respiratory: Negative for  sputum production, wheezing and stridor.   Cardiovascular: Negative for chest pain, palpitations, orthopnea, claudication, leg swelling and PND.  Gastrointestinal: Negative for heartburn, nausea, vomiting, abdominal pain, diarrhea, constipation, blood in stool and melena.  Genitourinary: Negative for dysuria, urgency, frequency, hematuria and flank pain.  Musculoskeletal: Negative for  myalgias, back pain, joint pain and falls.  Skin: Negative for itching and rash.  Neurological: Negative for dizziness, tingling, tremors, sensory change, speech  change, focal weakness, seizures, loss of consciousness, weakness and headaches.  Endo/Heme/Allergies: Negative for environmental allergies and polydipsia. Does not bruise/bleed easily.  SUBJECTIVE:   VITAL SIGNS: Temp:  [97 F (36.1 C)-98.9 F (37.2 C)] 98.1 F (36.7 C) (07/16 0857) Pulse Rate:  [78-105] 82 (07/16 0857) Resp:  [16-36] 16 (07/16 0857) BP: (99-122)/(53-73) 99/64 (07/16 0857) SpO2:  [80 %-99 %] 88 % (07/16 0857)  PHYSICAL EXAMINATION: General: Young man, sitting up in bed, coughing, anxious affect, mild distress Neuro: Alert, interactive, nonfocal HEENT: No pallor, icterus, nasal cannula, crusted blood around left nostril Cardiovascular: S1-S2 regular, no murmur Lungs: Crackles on the left, no rhonchi, no accessory muscle use Abdomen: Soft, nontender Musculoskeletal: No deformity, no edema Skin: No rash  Recent Labs  Lab 01/02/21 1135 01/03/21 0246  NA 133* 131*  K 4.3 3.6  CL 100 102  CO2 23 22  BUN <5* 6  CREATININE 0.78 0.72  GLUCOSE 114* 112*   Recent Labs  Lab 01/02/21 1135 01/04/21 0826  HGB 14.7 12.2*  HCT 42.9 35.8*  WBC 18.5* 18.2*  PLT 296 256   DG Orbits  Result Date: 01/03/2021 CLINICAL DATA:  Metal working/exposure; clearance prior to MRI EXAM: ORBITS - COMPLETE 4+ VIEW COMPARISON:  None. FINDINGS: There is no evidence of metallic foreign body within the orbits. No significant bone abnormality identified. IMPRESSION: No evidence of metallic foreign body within the orbits. Electronically Signed   By: Alcide Clever M.D.   On: 01/03/2021 01:54   MR PELVIS W WO CONTRAST  Result Date: 01/03/2021 CLINICAL DATA:  Abdominal abscess constipation rectal EXAM: MRI PELVIS WITHOUT AND WITH CONTRAST TECHNIQUE: Multiplanar multisequence MR imaging of the pelvis was performed both before and after administration of intravenous contrast. CONTRAST:  32mL GADAVIST GADOBUTROL 1 MMOL/ML IV SOLN COMPARISON:  CT abdomen pelvis 01/02/2021 FINDINGS: Urinary Tract:   No abnormality visualized. Bowel: There is a rim enhancing perianal abscess posterior to the anal canal, 6 o'clock face in lithotomy position, measuring approximately 3.8 x 3.4 x 1.9 cm (series 8, image 30, series 3, image 36). Otherwise unremarkable visualized pelvic bowel loops. Vascular/Lymphatic: Small, prominent, reactive perirectal lymph nodes (series 9, image 18). No significant vascular abnormality seen. Reproductive:  No mass or other significant abnormality Other:  None. Musculoskeletal: No suspicious bone lesions identified. IMPRESSION: 1. Rim enhancing perianal abscess posterior to the anal canal, 6 o'clock face in lithotomy position, measuring approximately 3.8 x 3.4 x 1.9 cm. 2. Small, prominent, reactive perirectal lymph nodes. Preliminary findings were documented by Dr. Gwenyth Bender. Electronically Signed   By: Lauralyn Primes M.D.   On: 01/03/2021 08:19   CT ABDOMEN PELVIS W CONTRAST  Result Date: 01/02/2021 CLINICAL DATA:  Abdominal pain and fever. Smoker. Nurse triage notes describe rectal pain and lack of ability to have bowel movements. EXAM: CT ABDOMEN AND PELVIS WITH CONTRAST TECHNIQUE: Multidetector CT imaging of the abdomen and pelvis was performed using the standard protocol following bolus administration of intravenous contrast. CONTRAST:  OMNIPAQUE IOHEXOL 300 MG/ML  SOLN COMPARISON:  None. FINDINGS: Lower chest: Right base scarring. Right lower lobe 5 mm pulmonary nodule on 15/5. 4 mm left lower lobe pulmonary nodule. Normal heart size without pericardial or pleural effusion. Hepatobiliary: Hepatomegaly at 22.2 cm craniocaudal. Mild caudate lobe enlargement. Normal gallbladder, without biliary ductal dilatation. Pancreas: Normal, without mass or ductal  dilatation. Spleen: Normal in size, without focal abnormality. Adrenals/Urinary Tract: Normal adrenal glands. Normal kidneys, without hydronephrosis. Normal urinary bladder. Stomach/Bowel: Normal stomach, without wall thickening.  Normal appendix, and terminal ileum. Normal small bowel. Subtle hypoattenuation within the deep pelvis including at 2.9 by 2.0 cm on 90/3. Favored to be positioned along the posterior wall of the anus. Also sagittal image 93. Vascular/Lymphatic: Aortic atherosclerosis. No abdominopelvic adenopathy. Reproductive: Normal prostate. Other: No significant free fluid. Musculoskeletal: No acute osseous abnormality. IMPRESSION: 1. Subtle hypoattenuation within the deep pelvis, suspicious for perianal/perirectal infection and developing abscess. This area is sub optimally evaluated by CT. Recommend physical exam correlation. High-resolution MRI on a nonemergent basis would likely be of increased accuracy. 2. Hepatomegaly.  Mild caudate lobe enlargement is nonspecific. 3. Bibasilar pulmonary nodules. No follow-up needed if patient is low-risk. Non-contrast chest CT can be considered in 12 months if patient is high-risk. This recommendation follows the consensus statement: Guidelines for Management of Incidental Pulmonary Nodules Detected on CT Images: From the Fleischner Society 2017; Radiology 2017; 284:228-243. Electronically Signed   By: Jeronimo Greaves M.D.   On: 01/02/2021 18:51   US Abdomen Limited RUQ (LIVER/GB)  Result Date: 01/03/2021 CLINICAL DATA:  Elevated LFTs EXAM: ULTRASOUND ABDOMEN LIMITED RIGHT UPPER QUADRANT COMPARISON:  CT from the previous day. FINDINGS: Gallbladder: No gallstones or wall thickening visualized. No sonographic Murphy sign noted by sonographer. Common bile duct: Diameter: 3.1 mm. Liver: Slight increased echogenicity is noted consistent with fatty infiltration. No mass is seen. Portal vein is patent on color Doppler imaging with normal direction of blood flow towards the liver. Other: None. IMPRESSION: Fatty liver. No other focal abnormality is noted. Electronically Signed   By: Alcide Clever M.D.   On: 01/03/2021 00:54    ASSESSMENT / PLAN:  New onset hemoptysis -which seems to be  completely unrelated to his admitting diagnosis of perianal abscess -Visualized lungs on CT abdomen appears normal except for subcentimeter nodules, is a never smoker -He has crusted blood around his left nostril which raises the question of epistaxis, no evidence of oral trauma, no postanesthesia complication documented -Suspicion for EtOH liver disease but coags and platelets appear normal  Acute hypoxic respiratory failure -related to above  Recommend -Afrin nasal spray,, ENT evaluation of upper airway especially to rule out left posterior nare bleeding , change to Ventimask and add humidity to oxygen -Tussionex 5 mL every 8 to decrease coughing -Oxygen as needed to maintain saturation 90% and above -Chest x-ray stat portable and CT chest without contrast -Agree with transfer to progressive care bed given hypoxia  Further considerations including bronchoscopy based on results of above , discussed with hospitalist  Cyril Mourning MD. FCCP. [facility] Pager : [phone]  If no response to pager , please call [phone] until 7 pm After 7:00 pm call Elink  [phone]     01/04/2021, 10:08 AM

## 2021-01-04 NOTE — Progress Notes (Signed)
Patient ID: Maxine GlennFrancisco Houchins, male   DOB: 02/07/1981, 40 y.o.   MRN: [id number] [facility] Progress Note:   1 Day Post-Op  Subjective: Mental status is alert.  Complaints hemoptysis. Objective: Vital signs in last 24 hours: Temp:  [97 F (36.1 C)-98.9 F (37.2 C)] 97.5 F (36.4 C) (07/16 95620613) Pulse Rate:  [78-105] 97 (07/16 0613) Resp:  [16-36] 17 (07/16 0613) BP: (100-122)/(53-73) 110/68 (07/16 13080613) SpO2:  [80 %-99 %] 95 % (07/16 65780613)  Intake/Output from previous day: 07/15 0701 - 07/16 0700 In: 1660 [P.O.:760; I.V.:500; IV Piggyback:400] Out: -  Intake/Output this shift: No intake/output data recorded.  Physical Exam: Work of breathing is not labored.  He has coughing spells with production of bright red sputum.  Packing removed from perianal abscess .    Lab Results:  Results for orders placed or performed during the hospital encounter of 01/02/21 (from the past 48 hour(s))  Lipase, blood     Status: None   Collection Time: 01/02/21 11:35 AM  Result Value Ref Range   Lipase 35 11 - 51 U/L    Comment: Performed at [facility], [address] [place], [place] [address]  Comprehensive metabolic panel     Status: Abnormal   Collection Time: 01/02/21 11:35 AM  Result Value Ref Range   Sodium 133 (L) 135 - 145 mmol/L   Potassium 4.3 3.5 - 5.1 mmol/L   Chloride 100 98 - 111 mmol/L   CO2 23 22 - 32 mmol/L   Glucose, Bld 114 (H) 70 - 99 mg/dL    Comment: Glucose reference range applies only to samples taken after fasting for at least 8 hours.   BUN <5 (L) 6 - 20 mg/dL   Creatinine, Ser 9.520.78 0.61 - 1.24 mg/dL   Calcium 9.1 8.9 - 84.110.3 mg/dL   Total Protein 8.6 (H) 6.5 - 8.1 g/dL   Albumin 3.5 3.5 - 5.0 g/dL   AST 324135 (H) 15 - 41 U/L   ALT 59 (H) 0 - 44 U/L   Alkaline Phosphatase 131 (H) 38 - 126 U/L   Total Bilirubin 1.1 0.3 - 1.2 mg/dL   GFR, Estimated >40>60 >10>60 mL/min    Comment: (NOTE) Calculated using the CKD-EPI Creatinine Equation (2021)    Anion  gap 10 5 - 15    Comment: Performed at [facility], [address] [place], [place] [address]  CBC     Status: Abnormal   Collection Time: 01/02/21 11:35 AM  Result Value Ref Range   WBC 18.5 (H) 4.0 - 10.5 K/uL   RBC 4.63 4.22 - 5.81 MIL/uL   Hemoglobin 14.7 13.0 - 17.0 g/dL   HCT 66.442.9 40.339.0 - 47.452.0 %   MCV 92.7 80.0 - 100.0 fL   MCH 31.7 26.0 - 34.0 pg   MCHC 34.3 30.0 - 36.0 g/dL   RDW 25.912.7 56.311.5 - 87.515.5 %   Platelets 296 150 - 400 K/uL   nRBC 0.0 0.0 - 0.2 %    Comment: Performed at [facility], [address] [place], [place] [address]  Urinalysis, Routine w reflex microscopic Urine, Clean Catch     Status: Abnormal   Collection Time: 01/02/21 11:35 AM  Result Value Ref Range   Color, Urine AMBER (A) YELLOW    Comment: BIOCHEMICALS MAY BE AFFECTED BY COLOR   APPearance CLEAR CLEAR   Specific Gravity, Urine 1.025 1.005 - 1.030   pH 6.0 5.0 - 8.0  Glucose, UA NEGATIVE NEGATIVE mg/dL   Hgb urine dipstick TRACE (A) NEGATIVE   Bilirubin Urine NEGATIVE NEGATIVE   Ketones, ur NEGATIVE NEGATIVE mg/dL   Protein, ur 30 (A) NEGATIVE mg/dL   Nitrite NEGATIVE NEGATIVE   Leukocytes,Ua NEGATIVE NEGATIVE    Comment: Performed at [facility], [address] [place], [place] [address]  Urinalysis, Microscopic (reflex)     Status: None   Collection Time: 01/02/21 11:35 AM  Result Value Ref Range   RBC / HPF 0-5 0 - 5 RBC/hpf   WBC, UA 0-5 0 - 5 WBC/hpf   Bacteria, UA NONE SEEN NONE SEEN   Squamous Epithelial / LPF 0-5 0 - 5   Mucus PRESENT     Comment: Performed at [facility], [address], [place], [place] [address]  POC occult blood, ED     Status: None   Collection Time: 01/02/21  4:31 PM  Result Value Ref Range   Fecal Occult Bld NEGATIVE NEGATIVE  Resp Panel by RT-PCR (Flu A&B, Covid) Nasopharyngeal Swab     Status: None   Collection Time: 01/02/21  7:48 PM   Specimen: Nasopharyngeal Swab; Nasopharyngeal(NP) swabs in vial transport medium  Result  Value Ref Range   SARS Coronavirus 2 by RT PCR NEGATIVE NEGATIVE    Comment: (NOTE) SARS-CoV-2 target nucleic acids are NOT DETECTED.  The SARS-CoV-2 RNA is generally detectable in upper respiratory specimens during the acute phase of infection. The lowest concentration of SARS-CoV-2 viral copies this assay can detect is 138 copies/mL. A negative result does not preclude SARS-Cov-2 infection and should not be used as the sole basis for treatment or other patient management decisions. A negative result may occur with  improper specimen collection/handling, submission of specimen other than nasopharyngeal swab, presence of viral mutation(s) within the areas targeted by this assay, and inadequate number of viral copies(<138 copies/mL). A negative result must be combined with clinical observations, patient history, and epidemiological information. The expected result is Negative.  Fact Sheet for Patients:  BloggerCourse.com  Fact Sheet for Healthcare Providers:  SeriousBroker.it  This test is no t yet approved or cleared by the [place] FDA and  has been authorized for detection and/or diagnosis of SARS-CoV-2 by FDA under an Emergency Use Authorization (EUA). This EUA will remain  in effect (meaning this test can be used) for the duration of the COVID-19 declaration under Section 564(b)(1) of the Act, 21 U.S.C.section 360bbb-3(b)(1), unless the authorization is terminated  or revoked sooner.       Influenza A by PCR NEGATIVE NEGATIVE   Influenza B by PCR NEGATIVE NEGATIVE    Comment: (NOTE) The Xpert Xpress SARS-CoV-2/FLU/RSV plus assay is intended as an aid in the diagnosis of influenza from Nasopharyngeal swab specimens and should not be used as a sole basis for treatment. Nasal washings and aspirates are unacceptable for Xpert Xpress SARS-CoV-2/FLU/RSV testing.  Fact Sheet for  Patients: BloggerCourse.com  Fact Sheet for Healthcare Providers: SeriousBroker.it  This test is not yet approved or cleared by the [place] FDA and has been authorized for detection and/or diagnosis of SARS-CoV-2 by FDA under an Emergency Use Authorization (EUA). This EUA will remain in effect (meaning this test can be used) for the duration of the COVID-19 declaration under Section 564(b)(1) of the Act, 21 U.S.C. section 360bbb-3(b)(1), unless the authorization is terminated or revoked.  Performed at [facility], [address], [place], [place] [address]   HIV Antibody (routine  testing w rflx)     Status: None   Collection Time: 01/03/21  2:46 AM  Result Value Ref Range   HIV Screen 4th Generation wRfx Non Reactive Non Reactive    Comment: Performed at [facility], [address], [place], [place] [address]  Protime-INR     Status: Abnormal   Collection Time: 01/03/21  2:46 AM  Result Value Ref Range   Prothrombin Time 15.8 (H) 11.4 - 15.2 seconds   INR 1.3 (H) 0.8 - 1.2    Comment: (NOTE) INR goal varies based on device and disease states. Performed at [facility], [address] [place], [place] [address]   Comprehensive metabolic panel     Status: Abnormal   Collection Time: 01/03/21  2:46 AM  Result Value Ref Range   Sodium 131 (L) 135 - 145 mmol/L   Potassium 3.6 3.5 - 5.1 mmol/L   Chloride 102 98 - 111 mmol/L   CO2 22 22 - 32 mmol/L   Glucose, Bld 112 (H) 70 - 99 mg/dL    Comment: Glucose reference range applies only to samples taken after fasting for at least 8 hours.   BUN 6 6 - 20 mg/dL   Creatinine, Ser 0.98 0.61 - 1.24 mg/dL   Calcium 8.5 (L) 8.9 - 10.3 mg/dL   Total Protein 7.9 6.5 - 8.1 g/dL   Albumin 3.0 (L) 3.5 - 5.0 g/dL   AST 99 (H) 15 - 41 U/L   ALT 45 (H) 0 - 44 U/L   Alkaline Phosphatase 117 38 - 126 U/L   Total Bilirubin 0.9 0.3 - 1.2 mg/dL   GFR, Estimated >11 >91  mL/min    Comment: (NOTE) Calculated using the CKD-EPI Creatinine Equation (2021)    Anion gap 7 5 - 15    Comment: Performed at [facility], [address], [place], [place] [address]  Surgical pcr screen     Status: Abnormal   Collection Time: 01/03/21  5:38 AM   Specimen: Nasal Mucosa; Nasal Swab  Result Value Ref Range   MRSA, PCR NEGATIVE NEGATIVE   Staphylococcus aureus POSITIVE (A) NEGATIVE    Comment: (NOTE) The Xpert SA Assay (FDA approved for NASAL specimens in patients 60 years of age and older), is one component of a comprehensive surveillance program. It is not intended to diagnose infection nor to guide or monitor treatment. Performed at [facility], [address], [place], [place] [address]     Radiology/Results: DG Orbits  Result Date: 01/03/2021 CLINICAL DATA:  Metal working/exposure; clearance prior to MRI EXAM: ORBITS - COMPLETE 4+ VIEW COMPARISON:  None. FINDINGS: There is no evidence of metallic foreign body within the orbits. No significant bone abnormality identified. IMPRESSION: No evidence of metallic foreign body within the orbits. Electronically Signed   By: Alcide Clever M.D.   On: 01/03/2021 01:54   MR PELVIS W WO CONTRAST  Result Date: 01/03/2021 CLINICAL DATA:  Abdominal abscess constipation rectal EXAM: MRI PELVIS WITHOUT AND WITH CONTRAST TECHNIQUE: Multiplanar multisequence MR imaging of the pelvis was performed both before and after administration of intravenous contrast. CONTRAST:  7mL GADAVIST GADOBUTROL 1 MMOL/ML IV SOLN COMPARISON:  CT abdomen pelvis 01/02/2021 FINDINGS: Urinary Tract:  No abnormality visualized. Bowel: There is a rim enhancing perianal abscess posterior to the anal canal, 6 o'clock face in lithotomy position, measuring approximately 3.8 x 3.4 x 1.9 cm (series 8, image 30, series 3, image 36). Otherwise unremarkable visualized  pelvic bowel loops. Vascular/Lymphatic: Small, prominent, reactive perirectal lymph nodes  (series 9, image 18). No significant vascular abnormality seen. Reproductive:  No mass or other significant abnormality Other:  None. Musculoskeletal: No suspicious bone lesions identified. IMPRESSION: 1. Rim enhancing perianal abscess posterior to the anal canal, 6 o'clock face in lithotomy position, measuring approximately 3.8 x 3.4 x 1.9 cm. 2. Small, prominent, reactive perirectal lymph nodes. Preliminary findings were documented by Dr. Gwenyth Bender. Electronically Signed   By: Lauralyn Primes M.D.   On: 01/03/2021 08:19   CT ABDOMEN PELVIS W CONTRAST  Result Date: 01/02/2021 CLINICAL DATA:  Abdominal pain and fever. Smoker. Nurse triage notes describe rectal pain and lack of ability to have bowel movements. EXAM: CT ABDOMEN AND PELVIS WITH CONTRAST TECHNIQUE: Multidetector CT imaging of the abdomen and pelvis was performed using the standard protocol following bolus administration of intravenous contrast. CONTRAST:  OMNIPAQUE IOHEXOL 300 MG/ML  SOLN COMPARISON:  None. FINDINGS: Lower chest: Right base scarring. Right lower lobe 5 mm pulmonary nodule on 15/5. 4 mm left lower lobe pulmonary nodule. Normal heart size without pericardial or pleural effusion. Hepatobiliary: Hepatomegaly at 22.2 cm craniocaudal. Mild caudate lobe enlargement. Normal gallbladder, without biliary ductal dilatation. Pancreas: Normal, without mass or ductal dilatation. Spleen: Normal in size, without focal abnormality. Adrenals/Urinary Tract: Normal adrenal glands. Normal kidneys, without hydronephrosis. Normal urinary bladder. Stomach/Bowel: Normal stomach, without wall thickening. Normal appendix, and terminal ileum. Normal small bowel. Subtle hypoattenuation within the deep pelvis including at 2.9 by 2.0 cm on 90/3. Favored to be positioned along the posterior wall of the anus. Also sagittal image 93. Vascular/Lymphatic: Aortic atherosclerosis. No abdominopelvic adenopathy. Reproductive: Normal prostate. Other: No significant free  fluid. Musculoskeletal: No acute osseous abnormality. IMPRESSION: 1. Subtle hypoattenuation within the deep pelvis, suspicious for perianal/perirectal infection and developing abscess. This area is sub optimally evaluated by CT. Recommend physical exam correlation. High-resolution MRI on a nonemergent basis would likely be of increased accuracy. 2. Hepatomegaly.  Mild caudate lobe enlargement is nonspecific. 3. Bibasilar pulmonary nodules. No follow-up needed if patient is low-risk. Non-contrast chest CT can be considered in 12 months if patient is high-risk. This recommendation follows the consensus statement: Guidelines for Management of Incidental Pulmonary Nodules Detected on CT Images: From the Fleischner Society 2017; Radiology 2017; 284:228-243. Electronically Signed   By: Jeronimo Greaves M.D.   On: 01/02/2021 18:51   US Abdomen Limited RUQ (LIVER/GB)  Result Date: 01/03/2021 CLINICAL DATA:  Elevated LFTs EXAM: ULTRASOUND ABDOMEN LIMITED RIGHT UPPER QUADRANT COMPARISON:  CT from the previous day. FINDINGS: Gallbladder: No gallstones or wall thickening visualized. No sonographic Murphy sign noted by sonographer. Common bile duct: Diameter: 3.1 mm. Liver: Slight increased echogenicity is noted consistent with fatty infiltration. No mass is seen. Portal vein is patent on color Doppler imaging with normal direction of blood flow towards the liver. Other: None. IMPRESSION: Fatty liver. No other focal abnormality is noted. Electronically Signed   By: Alcide Clever M.D.   On: 01/03/2021 00:54    Anti-infectives: Anti-infectives (From admission, onward)    Start     Dose/Rate Route Frequency Ordered Stop   01/03/21 0000  cefTRIAXone (ROCEPHIN) 2 g in sodium chloride 0.9 % 100 mL IVPB        2 g 200 mL/hr over 30 Minutes Intravenous Every 24 hours 01/02/21 2218     01/03/21 0000  metroNIDAZOLE (FLAGYL) IVPB 500 mg        500 mg 100 mL/hr over 60 Minutes  Intravenous Every 8 hours 01/02/21 2218     01/02/21  2000  piperacillin-tazobactam (ZOSYN) IVPB 3.375 g        3.375 g 100 mL/hr over 30 Minutes Intravenous  Once 01/02/21 1948 01/02/21 2218       Assessment/Plan: Problem List: Patient Active Problem List   Diagnosis Date Noted   Rectal pain 01/03/2021   LFT elevation 01/03/2021   Alcohol use 01/03/2021   Sepsis (HCC) 01/02/2021   Ruptured globe of right eye 07/14/2016   Eye trauma 07/13/2016   Traumatic hematoma of right orbit 07/13/2016   Visual loss, right eye 07/13/2016   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 OK for discharge from abscess point of view but the hemoptysis will need evaluation.   1 Day Post-Op    LOS: 1 day   Matt B. Daphine Deutscher, MD, [facility] [phone] to reach the surgeon on call.    01/04/2021 8:09 AM

## 2021-01-04 NOTE — Progress Notes (Signed)
Reassessed pt No distress, remains hypoxic, on 8 L, satn 92% ,able to ambulate to BR Hemoptysis decreasing per RN ENT eval -no upper airway source CXR & CT chest show BL fluffy infiltrates c/w aspirated blood, no etiology apparent Role of bronchoscopy in this setting would be to localise site of bleeding in case Bronchial artery embolisation required. Bscopy is not therapeutic. Would be high risk for mechanical ventilation given hypoxic to this degree already. Favor conservative aproach, if hemoptysis continues & hypoxia improved then will consider bronchoscopy  Trevor Dixon V. Vassie Loll MD

## 2021-01-05 DIAGNOSIS — J9601 Acute respiratory failure with hypoxia: Secondary | ICD-10-CM

## 2021-01-05 DIAGNOSIS — R042 Hemoptysis: Secondary | ICD-10-CM

## 2021-01-05 LAB — BASIC METABOLIC PANEL
Anion gap: 11 (ref 5–15)
BUN: 19 mg/dL (ref 6–20)
CO2: 20 mmol/L — ABNORMAL LOW (ref 22–32)
Calcium: 7.8 mg/dL — ABNORMAL LOW (ref 8.9–10.3)
Chloride: 103 mmol/L (ref 98–111)
Creatinine, Ser: 0.84 mg/dL (ref 0.61–1.24)
GFR, Estimated: >60 mL/min (ref >60)
Glucose, Bld: 150 mg/dL — ABNORMAL HIGH (ref 70–99)
Potassium: 3.5 mmol/L (ref 3.5–5.1)
Sodium: 134 mmol/L — ABNORMAL LOW (ref 135–145)

## 2021-01-05 LAB — CBC
HCT: 39.4 % (ref 39.0–52.0)
Hemoglobin: 12.9 g/dL — ABNORMAL LOW (ref 13.0–17.0)
MCH: 31.5 pg (ref 26.0–34.0)
MCHC: 32.7 g/dL (ref 30.0–36.0)
MCV: 96.1 fL (ref 80.0–100.0)
Platelets: 307 10*3/uL (ref 150–400)
RBC: 4.1 MIL/uL — ABNORMAL LOW (ref 4.22–5.81)
RDW: 13.1 % (ref 11.5–15.5)
WBC: 18.9 10*3/uL — ABNORMAL HIGH (ref 4.0–10.5)
nRBC: 0 % (ref 0.0–0.2)

## 2021-01-05 LAB — BRAIN NATRIURETIC PEPTIDE: B Natriuretic Peptide: 135.4 pg/mL — ABNORMAL HIGH (ref 0.0–100.0)

## 2021-01-05 MED ORDER — FUROSEMIDE 10 MG/ML IJ SOLN
20.0000 mg | Freq: Once | INTRAMUSCULAR | Status: AC
  Administered 2021-01-05: 20 mg via INTRAVENOUS
  Filled 2021-01-05: qty 2

## 2021-01-05 MED ORDER — SODIUM CHLORIDE 0.9 % IV SOLN
500.0000 mg | INTRAVENOUS | Status: DC
  Administered 2021-01-05: 500 mg via INTRAVENOUS
  Filled 2021-01-05 (×2): qty 500

## 2021-01-05 MED ORDER — TRAZODONE HCL 50 MG PO TABS
50.0000 mg | ORAL_TABLET | Freq: Every evening | ORAL | Status: AC | PRN
  Administered 2021-01-05 – 2021-01-09 (×3): 50 mg via ORAL
  Filled 2021-01-05 (×3): qty 1

## 2021-01-05 MED ORDER — POTASSIUM CHLORIDE CRYS ER 20 MEQ PO TBCR
40.0000 meq | EXTENDED_RELEASE_TABLET | Freq: Once | ORAL | Status: AC
  Administered 2021-01-05: 40 meq via ORAL
  Filled 2021-01-05: qty 2

## 2021-01-05 MED ORDER — OXYCODONE HCL 5 MG PO TABS: 5.0000 mg | ORAL_TABLET | Freq: Four times a day (QID) | ORAL | 0 refills | Status: AC | PRN

## 2021-01-05 NOTE — Progress Notes (Signed)
[facility] PROGRESS NOTE    Progress Note  Maxine GlennFrancisco Dimarzo  ZOX:[id number] DOB: 05/18/1981 DOA: 01/02/2021 PCP: System, Provider Not In     Brief Narrative:   Maxine GlennFrancisco Roskelley is an 40 y.o. male no significant past medical history presents with constipation and right buttock pain CT scan of the abdomen and pelvis was concerning for perirectal abscess.  General surgery was consulted we will start empirically on I Rocephin and Flagyl.  Significant studies: 01/02/2021 CT scan of the abdomen and pelvis shows subtle hypoattenuation of the deep pelvis suspicious for perirectal infection or developing abscess. 01/03/2021 abdominal ultrasound showed fatty liver.  Assessment/Plan:   Sepsis probably secondary to perirectal abscess: Status post I&D on 01/04/2021, by general surgery appreciate assistance. Continue IV Rocephin and Flagyl.   Hbg stable  Elevate LFTs: Endorses drinking 24 ounces beer daily right upper quadrant ultrasound showed fatty liver no acute findings.  New hemoptysis: ? Traumatic CT no tear or perforaion. Diffuse bilateral airspace disease. Now on 15L of oxygen to keep sat > 90% appears stable. Is currently on IV Rocephin and Flagyl.  D/c Vanc.   DVT prophylaxis:SCD Family Communication:none Status is: Observation  The patient will require care spanning > 2 midnights and should be moved to inpatient because: Hemodynamically unstable  Dispo: The patient is from: Home              Anticipated d/c is to: Home              Patient currently is not medically stable to d/c.   Difficult to place patient No   Code Status:     Code Status Orders  (From admission, onward)           Start     Ordered   01/02/21 2217  Full code  Continuous        01/02/21 2218           Code Status History     This patient has a current code status but no historical code status.         IV Access:   Peripheral IV   Procedures and diagnostic studies:    CT CHEST W CONTRAST  Result Date: 01/04/2021 CLINICAL DATA:  Postoperative hemoptysis EXAM: CT CHEST WITH CONTRAST TECHNIQUE: Multidetector CT imaging of the chest was performed during intravenous contrast administration. CONTRAST:  100mL OMNIPAQUE IOHEXOL 300 MG/ML  SOLN COMPARISON:  Radiograph same day FINDINGS: Cardiovascular: No significant vascular findings. Normal heart size. No pericardial effusion. Mediastinum/Nodes: No axillary or supraclavicular adenopathy. No mediastinal or hilar adenopathy. No pericardial fluid. Esophagus normal. Lungs/Pleura: There is diffuse perihilar airspace disease which reaches confluence the upper lobes and more patchy in the lower lobes. No pleural fluid. Airways normal. Upper Abdomen: Limited view of the liver, kidneys, pancreas are unremarkable. Normal adrenal glands. Musculoskeletal: No aggressive osseous lesion. IMPRESSION: 1. Diffuse bilateral perihilar airspace disease would be typical of flash pulmonary edema; however, with history of hemoptysis would also consider diffuse pulmonary hemorrhage. 2. No pleural fluid. 3. Airways normal. 4. No cardiovascular abnormality. Electronically Signed   By: Genevive BiStewart  Edmunds M.D.   On: 01/04/2021 12:07   DG CHEST PORT 1 VIEW  Result Date: 01/04/2021 CLINICAL DATA:  Respiratory failure EXAM: PORTABLE CHEST 1 VIEW COMPARISON:  01/04/2021 FINDINGS: Diffuse bilateral airspace disease . Normal cardiac silhouette. No pleural fluid. No pneumothorax. IMPRESSION: Diffuse bilateral airspace disease consistent with multifocal pneumonia versus pulmonary edema. Electronically Signed   By: Loura HaltStewart  Edmunds M.D.  On: 01/04/2021 11:12     Medical Consultants:   None.   Subjective:    Maxine Glenn improved cough no further mhemoptysis.  Objective:    Vitals:   01/04/21 2346 01/04/21 2349 01/05/21 0100 01/05/21 0433  BP:   135/65 (!) 108/52  Pulse: 93 (!) 103 87   Resp: (!) 21 (!) 27 19   Temp:   98.9 F (37.2 C) 98.6  F (37 C)  TempSrc:   Oral Oral  SpO2: (!) 85% 91% 90%   Weight:      Height:       SpO2: 90 % O2 Flow Rate (L/min): 15 L/min FiO2 (%): 40 %   Intake/Output Summary (Last 24 hours) at 01/05/2021 0701 Last data filed at 01/05/2021 0304 Gross per 24 hour  Intake 655.81 ml  Output 390 ml  Net 265.81 ml    Filed Weights   01/02/21 1134  Weight: 68 kg    Exam: General exam: In no acute distress. Respiratory system: good air movement crackle bilaterally Cardiovascular system: RRR S1 and S2 present. Gastrointestinal system: Abdomen is nondistended, soft and nontender.  Extremities: No pedal edema. Skin: No rashes, lesions or ulcers  Data Reviewed:    Labs: Basic Metabolic Panel: Recent Labs  Lab 01/02/21 1135 01/03/21 0246  NA 133* 131*  K 4.3 3.6  CL 100 102  CO2 23 22  GLUCOSE 114* 112*  BUN <5* 6  CREATININE 0.78 0.72  CALCIUM 9.1 8.5*    GFR Estimated Creatinine Clearance: 110.8 mL/min (by C-G formula based on SCr of 0.72 mg/dL). Liver Function Tests: Recent Labs  Lab 01/02/21 1135 01/03/21 0246  AST 135* 99*  ALT 59* 45*  ALKPHOS 131* 117  BILITOT 1.1 0.9  PROT 8.6* 7.9  ALBUMIN 3.5 3.0*    Recent Labs  Lab 01/02/21 1135  LIPASE 35    No results for input(s): AMMONIA in the last 168 hours. Coagulation profile Recent Labs  Lab 01/03/21 0246  INR 1.3*    COVID-19 Labs  No results for input(s): DDIMER, FERRITIN, LDH, CRP in the last 72 hours.  Lab Results  Component Value Date   SARSCOV2NAA NEGATIVE 01/02/2021    CBC: Recent Labs  Lab 01/02/21 1135 01/04/21 0826  WBC 18.5* 18.2*  HGB 14.7 12.2*  HCT 42.9 35.8*  MCV 92.7 93.2  PLT 296 256    Cardiac Enzymes: No results for input(s): CKTOTAL, CKMB, CKMBINDEX, TROPONINI in the last 168 hours. BNP (last 3 results) No results for input(s): PROBNP in the last 8760 hours. CBG: No results for input(s): GLUCAP in the last 168 hours. D-Dimer: No results for input(s): DDIMER  in the last 72 hours. Hgb A1c: No results for input(s): HGBA1C in the last 72 hours. Lipid Profile: No results for input(s): CHOL, HDL, LDLCALC, TRIG, CHOLHDL, LDLDIRECT in the last 72 hours. Thyroid function studies: No results for input(s): TSH, T4TOTAL, T3FREE, THYROIDAB in the last 72 hours.  Invalid input(s): FREET3 Anemia work up: No results for input(s): VITAMINB12, FOLATE, FERRITIN, TIBC, IRON, RETICCTPCT in the last 72 hours. Sepsis Labs: Recent Labs  Lab 01/02/21 1135 01/04/21 0826  WBC 18.5* 18.2*    Microbiology Recent Results (from the past 240 hour(s))  Resp Panel by RT-PCR (Flu A&B, Covid) Nasopharyngeal Swab     Status: None   Collection Time: 01/02/21  7:48 PM   Specimen: Nasopharyngeal Swab; Nasopharyngeal(NP) swabs in vial transport medium  Result Value Ref Range Status   SARS Coronavirus 2 by  RT PCR NEGATIVE NEGATIVE Final    Comment: (NOTE) SARS-CoV-2 target nucleic acids are NOT DETECTED.  The SARS-CoV-2 RNA is generally detectable in upper respiratory specimens during the acute phase of infection. The lowest concentration of SARS-CoV-2 viral copies this assay can detect is 138 copies/mL. A negative result does not preclude SARS-Cov-2 infection and should not be used as the sole basis for treatment or other patient management decisions. A negative result may occur with  improper specimen collection/handling, submission of specimen other than nasopharyngeal swab, presence of viral mutation(s) within the areas targeted by this assay, and inadequate number of viral copies(<138 copies/mL). A negative result must be combined with clinical observations, patient history, and epidemiological information. The expected result is Negative.  Fact Sheet for Patients:  BloggerCourse.com  Fact Sheet for Healthcare Providers:  SeriousBroker.it  This test is no t yet approved or cleared by the [place] FDA and   has been authorized for detection and/or diagnosis of SARS-CoV-2 by FDA under an Emergency Use Authorization (EUA). This EUA will remain  in effect (meaning this test can be used) for the duration of the COVID-19 declaration under Section 564(b)(1) of the Act, 21 U.S.C.section 360bbb-3(b)(1), unless the authorization is terminated  or revoked sooner.       Influenza A by PCR NEGATIVE NEGATIVE Final   Influenza B by PCR NEGATIVE NEGATIVE Final    Comment: (NOTE) The Xpert Xpress SARS-CoV-2/FLU/RSV plus assay is intended as an aid in the diagnosis of influenza from Nasopharyngeal swab specimens and should not be used as a sole basis for treatment. Nasal washings and aspirates are unacceptable for Xpert Xpress SARS-CoV-2/FLU/RSV testing.  Fact Sheet for Patients: BloggerCourse.com  Fact Sheet for Healthcare Providers: SeriousBroker.it  This test is not yet approved or cleared by the [place] FDA and has been authorized for detection and/or diagnosis of SARS-CoV-2 by FDA under an Emergency Use Authorization (EUA). This EUA will remain in effect (meaning this test can be used) for the duration of the COVID-19 declaration under Section 564(b)(1) of the Act, 21 U.S.C. section 360bbb-3(b)(1), unless the authorization is terminated or revoked.  Performed at [facility], [address] [place], [place] [address]   Surgical pcr screen     Status: Abnormal   Collection Time: 01/03/21  5:38 AM   Specimen: Nasal Mucosa; Nasal Swab  Result Value Ref Range Status   MRSA, PCR NEGATIVE NEGATIVE Final   Staphylococcus aureus POSITIVE (A) NEGATIVE Final    Comment: (NOTE) The Xpert SA Assay (FDA approved for NASAL specimens in patients 88 years of age and older), is one component of a comprehensive surveillance program. It is not intended to diagnose infection nor to guide or monitor treatment. Performed at [facility]  [facility], [address] [place], [place] [address]   Aerobic/Anaerobic Culture w Gram Stain (surgical/deep wound)     Status: None (Preliminary result)   Collection Time: 01/03/21  4:26 PM   Specimen: Abscess  Result Value Ref Range Status   Specimen Description ABSCESS  Final   Special Requests PERIRECTAL ABSCESS SPEC A  Final   Gram Stain PENDING  Incomplete   Culture   Final    CULTURE REINCUBATED FOR BETTER GROWTH Performed at [facility], [address] [place], [place] [address]    Report Status PENDING  Incomplete     Medications:    Chlorhexidine Gluconate Cloth  6 each Topical Q0600   chlorpheniramine-HYDROcodone  5 mL Oral Q8H  ketorolac  30 mg Intravenous Q6H   mupirocin ointment  1 application Nasal BID   Continuous Infusions:  cefTRIAXone (ROCEPHIN)  IV 2 g (01/04/21 2349)   metronidazole 500 mg (01/05/21 0053)   vancomycin 1,250 mg (01/04/21 2214)      LOS: 2 days   Marinda Elk  [facility]  01/05/2021, 7:01 AM

## 2021-01-05 NOTE — Progress Notes (Addendum)
  Rapid at bedside for increasing tachypnea.  She had taken him down to 13L on salter HFNC but patient had more coughing (no blood) and increased tachypnea in the 30's. Saturations remain > 95%, however patient does say he would take/ like more "air".     Lasix 20 mg given 1hr ago, output thus far. BNP noted at 135  Does not seem in any distress.  Dry cough.  Alert, talkative, asking to get up and walk around for pulmonary hygiene.  Advised not at this time due to tachypnea/ O2 needs but could get in bedside chair.  Bilateral rales L> R.   P:  Echo ordered.  Additional lasix 20 meq.  BP stable.  Check BMET, likely will need K replete Can change to [facility] for more support /"air" At this time, he is ok to remain in PCU.  ?consider autoimmune workup vs trial of steroids (would need to check with [facility] first given recent perianal abscess).  Ideally needs bronch but O2 requirements too high at this time, will discuss with attending.       Posey Boyer, ACNP [facility] 01/05/2021, 5:19 PM  See Amion for pager If no response to pager, please call PCCM consult pager After 7:00 pm call Elink

## 2021-01-05 NOTE — Progress Notes (Signed)
Name: Trevor Dixon MRN: [id number] DOB: 05-Jan-1981    ADMISSION DATE:  01/02/2021 CONSULTATION DATE:  01/05/2021   REFERRING MD :  Darin Engels TRH  CHIEF COMPLAINT: Hemoptysis  BRIEF PATIENT DESCRIPTION: 40 year old never smoker admitted with perirectal abscess status post I&D, developed new onset hemoptysis and hypoxia  SIGNIFICANT EVENTS  7/15 I&D of perianal abscess 7/16 transferred to progressive care for hypoxia and hemoptysis  STUDIES:  CT abdomen/pelvis 7/14 2.9 cm perianal abscess , right lower lobe pulmonary nodule and 4 mm left lower lobe nodule  CT chest with contrast 7/16 diffuse bilateral perihilar airspace disease   HISTORY OF PRESENT ILLNESS: 40 year old never smoker, Spanish-speaking male, admitted 7/15 with constipation and right buttock pain making it difficult to walk with subjective fevers. Febrile 101 on admit mildly abnormal LFTs, WBC of 18.5, CT abdomen/pelvis showed perianal deep pelvic abscess.  Given empiric antibiotics and underwent I&D of abscess under general anesthesia, procedure uneventful , grade 1 airway noted by anesthesia and ETT to obtain on first attempt.  He developed hemoptysis, reports started right after surgical procedure, coughing all night, bringing up blood, he shows me several tissues with blood on them, has required increasing oxygen by nasal cannula to 5 L currently to maintain saturation 90% and above. Denies smoking or drug use, admits to drinking 3 beers daily Has never had withdrawal. HIV negative  SUBJECTIVE:  Continues to complain of coughing No further hemoptysis Breathing okay On high flow nasal cannula 13 L   VITAL SIGNS: Temp:  [98.2 F (36.8 C)-98.9 F (37.2 C)] 98.2 F (36.8 C) (07/17 1158) Pulse Rate:  [87-103] 101 (07/17 1158) Resp:  [19-27] 21 (07/17 1158) BP: (100-135)/(46-65) 124/56 (07/17 1158) SpO2:  [85 %-96 %] 95 % (07/17 1158)  PHYSICAL EXAMINATION: General: Young man, sitting up in bed, coughing,  anxious affect, no distress Neuro: Alert, interactive, nonfocal HEENT: No pallor, icterus, crusted blood around left nostril Cardiovascular: S1-S2 regular, no murmur Lungs: No accessory muscle use, crackles left base Abdomen: Soft, nontender Musculoskeletal: No deformity, no edema Skin: No rash  Recent Labs  Lab 01/02/21 1135 01/03/21 0246  NA 133* 131*  K 4.3 3.6  CL 100 102  CO2 23 22  BUN <5* 6  CREATININE 0.78 0.72  GLUCOSE 114* 112*    Recent Labs  Lab 01/02/21 1135 01/04/21 0826 01/05/21 1107  HGB 14.7 12.2* 12.9*  HCT 42.9 35.8* 39.4  WBC 18.5* 18.2* 18.9*  PLT 296 256 307    CT CHEST W CONTRAST  Result Date: 01/04/2021 CLINICAL DATA:  Postoperative hemoptysis EXAM: CT CHEST WITH CONTRAST TECHNIQUE: Multidetector CT imaging of the chest was performed during intravenous contrast administration. CONTRAST:  OMNIPAQUE IOHEXOL 300 MG/ML  SOLN COMPARISON:  Radiograph same day FINDINGS: Cardiovascular: No significant vascular findings. Normal heart size. No pericardial effusion. Mediastinum/Nodes: No axillary or supraclavicular adenopathy. No mediastinal or hilar adenopathy. No pericardial fluid. Esophagus normal. Lungs/Pleura: There is diffuse perihilar airspace disease which reaches confluence the upper lobes and more patchy in the lower lobes. No pleural fluid. Airways normal. Upper Abdomen: Limited view of the liver, kidneys, pancreas are unremarkable. Normal adrenal glands. Musculoskeletal: No aggressive osseous lesion. IMPRESSION: 1. Diffuse bilateral perihilar airspace disease would be typical of flash pulmonary edema; however, with history of hemoptysis would also consider diffuse pulmonary hemorrhage. 2. No pleural fluid. 3. Airways normal. 4. No cardiovascular abnormality. Electronically Signed   By: Genevive Bi M.D.   On: 01/04/2021 12:07   DG CHEST PORT 1 VIEW  Result Date: 01/04/2021 CLINICAL DATA:  Respiratory failure EXAM: PORTABLE CHEST 1 VIEW  COMPARISON:  01/04/2021 FINDINGS: Diffuse bilateral airspace disease . Normal cardiac silhouette. No pleural fluid. No pneumothorax. IMPRESSION: Diffuse bilateral airspace disease consistent with multifocal pneumonia versus pulmonary edema. Electronically Signed   By: Genevive Bi M.D.   On: 01/04/2021 11:12    ASSESSMENT / PLAN:  New onset hemoptysis -which seems to be completely unrelated to his admitting diagnosis of perianal abscess -Visualized lungs on CT abdomen appears normal except for subcentimeter nodules, is a never smoker -He has crusted blood around his left nostril which raises the question of epistaxis, ENT evaluation negative, no postanesthesia complication documented -Suspicion for EtOH liver disease but coags and platelets appear normal -Imaging findings could be aspirated blood, less likely pulmonary edema, no reason to suspect DAH in this setting  Acute hypoxic respiratory failure -on HFNC, related to above  Recommend -Check BNP, Lasix 20 mg x 1 , if BNP is high we will check echo -Tussionex 5 mL every 8 to decrease coughing -Hemoptysis appears to have resolved , role of bronchoscopy in the setting would only be to localize side of bleeding to enable bronchial artery embolization if needed, not necessary at this time    Cyril Mourning MD. FCCP.  Pulmonary & Critical care Pager : [phone]  If no response to pager , please call [phone] until 7 pm After 7:00 pm call Elink  [phone]     01/05/2021, 1:23 PM

## 2021-01-05 NOTE — Significant Event (Signed)
Rapid Response Event Note   Reason for Call :  Tachypnea  Initial Focused Assessment:  Patient has been having increased tachypnea (34-39) and has been requiring more oxygen that previously. The patient does not appear to be in respiratory distress at the moment. He has significant amount of blood in his lungs and has had hemoptysis. The patient states that his hemoptysis is better than yesterday, but his cough is ever persistent and nagging. Currently, the patient is on Salter [place] at 15, which is an increase from earlier. His oxygen saturations are WNR, and the patient does not appear to be hypoxic at the moment.   CCM had ordered lasix and the patient voided 400 mL. The patient had mentioned that the MD this morning had told him that as long as he can get up and walk around, he would get better. He was eager to participate in ambulation, but until his oxygen issues resolve, he needs to remain in bed or the chair.   BP 105/71 ECG 101 RR 38 O2 95 on 15 L [place]ter [place]   Interventions:  No interventions at the current time  Plan of Care:  Patient will get 20 mg more of lasix. CCM ordered echo.   Event Summary:   MD Notified: Trevor Dixon and CCM bedside Call Time: 1610 Arrival Time: 1620 End Time: 1720  Trevor Spearman, RN

## 2021-01-05 NOTE — Progress Notes (Addendum)
2 Days Post-Op  Subjective: CC: No real pain at incision. No complaints this morning. Still has a cough but no further hemoptysis   Objective: Vital signs in last 24 hours: Temp:  [98.2 F (36.8 C)-98.9 F (37.2 C)] 98.2 F (36.8 C) (07/17 0800) Pulse Rate:  [87-106] 88 (07/17 0800) Resp:  [19-27] 19 (07/17 0800) BP: (100-135)/(46-74) 102/46 (07/17 0800) SpO2:  [85 %-96 %] 96 % (07/17 0800) FiO2 (%):  [40 %] 40 % (07/16 1025) Last BM Date: 01/02/21  Intake/Output from previous day: 07/16 0701 - 07/17 0700 In: 655.8 [IV Piggyback:655.8] Out: 390 [Urine:390] Intake/Output this shift: No intake/output data recorded.  PE: Gen: Awake and alert, NAD Lungs: Normal rate and effort Abd: Soft, ND, NT GU: posterior perianal incision noted and open without surrounding erythema, heat, induration. No drainage.   Lab Results:  Recent Labs    01/02/21 1135 01/04/21 0826  WBC 18.5* 18.2*  HGB 14.7 12.2*  HCT 42.9 35.8*  PLT 296 256   BMET Recent Labs    01/02/21 1135 01/03/21 0246  NA 133* 131*  K 4.3 3.6  CL 100 102  CO2 23 22  GLUCOSE 114* 112*  BUN <5* 6  CREATININE 0.78 0.72  CALCIUM 9.1 8.5*   PT/INR Recent Labs    01/03/21 0246  LABPROT 15.8*  INR 1.3*   CMP     Component Value Date/Time   NA 131 (L) 01/03/2021 0246   K 3.6 01/03/2021 0246   CL 102 01/03/2021 0246   CO2 22 01/03/2021 0246   GLUCOSE 112 (H) 01/03/2021 0246   BUN 6 01/03/2021 0246   CREATININE 0.72 01/03/2021 0246   CALCIUM 8.5 (L) 01/03/2021 0246   PROT 7.9 01/03/2021 0246   ALBUMIN 3.0 (L) 01/03/2021 0246   AST 99 (H) 01/03/2021 0246   ALT 45 (H) 01/03/2021 0246   ALKPHOS 117 01/03/2021 0246   BILITOT 0.9 01/03/2021 0246   GFRNONAA >60 01/03/2021 0246   GFRAA >60 07/14/2016 0046   Lipase     Component Value Date/Time   LIPASE 35 01/02/2021 1135       Studies/Results: CT CHEST W CONTRAST  Result Date: 01/04/2021 CLINICAL DATA:  Postoperative hemoptysis EXAM: CT  CHEST WITH CONTRAST TECHNIQUE: Multidetector CT imaging of the chest was performed during intravenous contrast administration. CONTRAST:  OMNIPAQUE IOHEXOL 300 MG/ML  SOLN COMPARISON:  Radiograph same day FINDINGS: Cardiovascular: No significant vascular findings. Normal heart size. No pericardial effusion. Mediastinum/Nodes: No axillary or supraclavicular adenopathy. No mediastinal or hilar adenopathy. No pericardial fluid. Esophagus normal. Lungs/Pleura: There is diffuse perihilar airspace disease which reaches confluence the upper lobes and more patchy in the lower lobes. No pleural fluid. Airways normal. Upper Abdomen: Limited view of the liver, kidneys, pancreas are unremarkable. Normal adrenal glands. Musculoskeletal: No aggressive osseous lesion. IMPRESSION: 1. Diffuse bilateral perihilar airspace disease would be typical of flash pulmonary edema; however, with history of hemoptysis would also consider diffuse pulmonary hemorrhage. 2. No pleural fluid. 3. Airways normal. 4. No cardiovascular abnormality. Electronically Signed   By: Genevive Bi M.D.   On: 01/04/2021 12:07   DG CHEST PORT 1 VIEW  Result Date: 01/04/2021 CLINICAL DATA:  Respiratory failure EXAM: PORTABLE CHEST 1 VIEW COMPARISON:  01/04/2021 FINDINGS: Diffuse bilateral airspace disease . Normal cardiac silhouette. No pleural fluid. No pneumothorax. IMPRESSION: Diffuse bilateral airspace disease consistent with multifocal pneumonia versus pulmonary edema. Electronically Signed   By: Genevive Bi M.D.   On: 01/04/2021 11:12  Anti-infectives: Anti-infectives (From admission, onward)    Start     Dose/Rate Route Frequency Ordered Stop   01/04/21 2200  vancomycin (VANCOREADY) IVPB 1250 mg/250 mL  Status:  Discontinued        1,250 mg 166.7 mL/hr over 90 Minutes Intravenous Every 12 hours 01/04/21 0932 01/05/21 0708   01/04/21 1015  vancomycin (VANCOREADY) IVPB 1500 mg/300 mL        1,500 mg 150 mL/hr over 120 Minutes  Intravenous  Once 01/04/21 0932 01/04/21 1302   01/03/21 0000  cefTRIAXone (ROCEPHIN) 2 g in sodium chloride 0.9 % 100 mL IVPB        2 g 200 mL/hr over 30 Minutes Intravenous Every 24 hours 01/02/21 2218     01/03/21 0000  metroNIDAZOLE (FLAGYL) IVPB 500 mg        500 mg 100 mL/hr over 60 Minutes Intravenous Every 8 hours 01/02/21 2218     01/02/21 2000  piperacillin-tazobactam (ZOSYN) IVPB 3.375 g        3.375 g 100 mL/hr over 30 Minutes Intravenous  Once 01/02/21 1948 01/02/21 2218        Assessment/Plan POD 2, s/p Incision and drainage of perianal abscess - Dr. Dwain Sarna, 01/03/2021 - Okay for d/c from a surgical standpoint. Hemoptysis per CCM and TRH - Cont sitz baths tid - Abx x 5d post op. Okay to switch to Augmentin. Cx's pending - Will send pain medication to his pharmacy and arrange follow up. We will be available as needed moving forward. Please call back with questions or concerns.    LOS: 2 days    Jacinto Halim , [facility] 01/05/2021, 9:09 AM Please see Amion for pager number during day hours 7:00am-4:30pm

## 2021-01-05 NOTE — Progress Notes (Signed)
Asked to see by day team in follow-up.  On my evaluation patient is resting comfortably.  His respiratory rate remains 25-30 he is on 15 L high flow nasal cannula O2 saturation is 96 to 98%.  Chest sounds remain coarse.  Chest x-ray and case reviewed.  We will add Zithromax to current regimen to cover for possible community-acquired pneumonia although this most likely represents a diffuse aspiration versus last pulmonary edema.  Agree with continuing Lasix.  Chest x-ray ordered for in the morning.

## 2021-01-06 ENCOUNTER — Inpatient Hospital Stay (HOSPITAL_COMMUNITY): Payer: Self-pay

## 2021-01-06 DIAGNOSIS — J9601 Acute respiratory failure with hypoxia: Secondary | ICD-10-CM

## 2021-01-06 LAB — ECHOCARDIOGRAM COMPLETE
Area-P 1/2: 8.25 cm2
Calc EF: 58.5 %
Height: 66 in
S' Lateral: 3.6 cm
Single Plane A2C EF: 61.1 %
Single Plane A4C EF: 56.5 %
Weight: 2400 oz

## 2021-01-06 LAB — AEROBIC/ANAEROBIC CULTURE W GRAM STAIN (SURGICAL/DEEP WOUND)

## 2021-01-06 LAB — BRAIN NATRIURETIC PEPTIDE: B Natriuretic Peptide: 77.1 pg/mL (ref 0.0–100.0)

## 2021-01-06 MED ORDER — PROMETHAZINE-CODEINE 6.25-10 MG/5ML PO SYRP
5.0000 mL | ORAL_SOLUTION | Freq: Four times a day (QID) | ORAL | Status: DC | PRN
Start: 2021-01-06 — End: 2021-01-14

## 2021-01-06 MED ORDER — HYDROCOD POLST-CPM POLST ER 10-8 MG/5ML PO SUER
5.0000 mL | Freq: Two times a day (BID) | ORAL | Status: AC
  Administered 2021-01-06 – 2021-01-08 (×6): 5 mL via ORAL
  Filled 2021-01-06 (×6): qty 5

## 2021-01-06 MED ORDER — TRANEXAMIC ACID FOR INHALATION
500.0000 mg | Freq: Three times a day (TID) | RESPIRATORY_TRACT | Status: AC
  Administered 2021-01-06 – 2021-01-07 (×3): 500 mg via RESPIRATORY_TRACT
  Filled 2021-01-06 (×3): qty 10

## 2021-01-06 NOTE — Progress Notes (Signed)
  Echocardiogram 2D Echocardiogram has been performed.  Janalyn Harder 01/06/2021, 11:09 AM

## 2021-01-06 NOTE — Progress Notes (Addendum)
[facility] PROGRESS NOTE    Progress Note  Trevor Dixon  PYP:[id number] DOB: [birth year] DOA: 01/02/2021 PCP: System, Provider Not In     Brief Narrative:   Trevor Dixon is an 40 y.o. male no significant past medical history presents with constipation and right buttock pain CT scan of the abdomen and pelvis was concerning for perirectal abscess.  General surgery was consulted we will start empirically on I Rocephin and Flagyl.  Significant studies: 01/02/2021 CT scan of the abdomen and pelvis shows subtle hypoattenuation of the deep pelvis suspicious for perirectal infection or developing abscess. 01/03/2021 abdominal ultrasound showed fatty liver.  Assessment/Plan:   Sepsis probably secondary to perirectal abscess: Status post I&D on 01/04/2021, by general surgery appreciate assistance. Continue IV Rocephin and Flagyl.   Hbg stable, discontinue vanc and azithromycin.  Elevate LFTs: Endorses drinking 24 ounces beer daily right upper quadrant ultrasound showed fatty liver no acute findings.  Acute hypoxic resp failure due to hemoptysis: ? Traumatic CT no tear or perforaion. Diffuse bilateral airspace disease. Still requiring 15 L High flow nasal canula. ENT found no source on nasal cavity. Appears to be comfortable. Antibiotics are for perirectal abscess.    DVT prophylaxis:SCD Family Communication:none Status is: Observation  The patient will require care spanning > 2 midnights and should be moved to inpatient because: Hemodynamically unstable  Dispo: The patient is from: Home              Anticipated d/c is to: Home              Patient currently is not medically stable to d/c.   Difficult to place patient No   Code Status:     Code Status Orders  (From admission, onward)           Start     Ordered   01/02/21 2217  Full code  Continuous        01/02/21 2218           Code Status History     This patient has a current code status but no  historical code status.         IV Access:   Peripheral IV   Procedures and diagnostic studies:   CT CHEST W CONTRAST  Result Date: 01/04/2021 CLINICAL DATA:  Postoperative hemoptysis EXAM: CT CHEST WITH CONTRAST TECHNIQUE: Multidetector CT imaging of the chest was performed during intravenous contrast administration. CONTRAST:  OMNIPAQUE IOHEXOL 300 MG/ML  SOLN COMPARISON:  Radiograph same day FINDINGS: Cardiovascular: No significant vascular findings. Normal heart size. No pericardial effusion. Mediastinum/Nodes: No axillary or supraclavicular adenopathy. No mediastinal or hilar adenopathy. No pericardial fluid. Esophagus normal. Lungs/Pleura: There is diffuse perihilar airspace disease which reaches confluence the upper lobes and more patchy in the lower lobes. No pleural fluid. Airways normal. Upper Abdomen: Limited view of the liver, kidneys, pancreas are unremarkable. Normal adrenal glands. Musculoskeletal: No aggressive osseous lesion. IMPRESSION: 1. Diffuse bilateral perihilar airspace disease would be typical of flash pulmonary edema; however, with history of hemoptysis would also consider diffuse pulmonary hemorrhage. 2. No pleural fluid. 3. Airways normal. 4. No cardiovascular abnormality. Electronically Signed   By: Genevive Bi M.D.   On: 01/04/2021 12:07   DG CHEST PORT 1 VIEW  Result Date: 01/04/2021 CLINICAL DATA:  Respiratory failure EXAM: PORTABLE CHEST 1 VIEW COMPARISON:  01/04/2021 FINDINGS: Diffuse bilateral airspace disease . Normal cardiac silhouette. No pleural fluid. No pneumothorax. IMPRESSION: Diffuse bilateral airspace disease consistent with multifocal pneumonia versus  pulmonary edema. Electronically Signed   By: Genevive Bi M.D.   On: 01/04/2021 11:12     Medical Consultants:   None.   Subjective:    Hyde Rohl breathing stable.  Objective:    Vitals:   01/05/21 2300 01/06/21 0040 01/06/21 0230 01/06/21 0356  BP:  100/69     Pulse: 76  85   Resp: (!) 21  (!) 22   Temp:  98.7 F (37.1 C)  98.2 F (36.8 C)  TempSrc:  Axillary  Oral  SpO2: 97% 99% 98%   Weight:      Height:       SpO2: 98 % O2 Flow Rate (L/min): 15 L/min FiO2 (%): 40 %   Intake/Output Summary (Last 24 hours) at 01/06/2021 0729 Last data filed at 01/06/2021 0335 Gross per 24 hour  Intake 657.89 ml  Output --  Net 657.89 ml    Filed Weights   01/02/21 1134  Weight: 68 kg    Exam: General exam: In no acute distress. Respiratory system: good air movement crackle bilaterally Cardiovascular system: RRR S1 and S2 present. Gastrointestinal system: Abdomen is nondistended, soft and nontender.  Extremities: No pedal edema. Skin: No rashes, lesions or ulcers  Data Reviewed:    Labs: Basic Metabolic Panel: Recent Labs  Lab 01/02/21 1135 01/03/21 0246 01/05/21 1804  NA 133* 131* 134*  K 4.3 3.6 3.5  CL 100 102 103  CO2 23 22 20*  GLUCOSE 114* 112* 150*  BUN <5* 6 19  CREATININE 0.78 0.72 0.84  CALCIUM 9.1 8.5* 7.8*    GFR Estimated Creatinine Clearance: 105.5 mL/min (by C-G formula based on SCr of 0.84 mg/dL). Liver Function Tests: Recent Labs  Lab 01/02/21 1135 01/03/21 0246  AST 135* 99*  ALT 59* 45*  ALKPHOS 131* 117  BILITOT 1.1 0.9  PROT 8.6* 7.9  ALBUMIN 3.5 3.0*    Recent Labs  Lab 01/02/21 1135  LIPASE 35    No results for input(s): AMMONIA in the last 168 hours. Coagulation profile Recent Labs  Lab 01/03/21 0246  INR 1.3*    COVID-19 Labs  No results for input(s): DDIMER, FERRITIN, LDH, CRP in the last 72 hours.  Lab Results  Component Value Date   SARSCOV2NAA NEGATIVE 01/02/2021    CBC: Recent Labs  Lab 01/02/21 1135 01/04/21 0826 01/05/21 1107  WBC 18.5* 18.2* 18.9*  HGB 14.7 12.2* 12.9*  HCT 42.9 35.8* 39.4  MCV 92.7 93.2 96.1  PLT 296 256 307    Cardiac Enzymes: No results for input(s): CKTOTAL, CKMB, CKMBINDEX, TROPONINI in the last 168 hours. BNP (last 3  results) No results for input(s): PROBNP in the last 8760 hours. CBG: No results for input(s): GLUCAP in the last 168 hours. D-Dimer: No results for input(s): DDIMER in the last 72 hours. Hgb A1c: No results for input(s): HGBA1C in the last 72 hours. Lipid Profile: No results for input(s): CHOL, HDL, LDLCALC, TRIG, CHOLHDL, LDLDIRECT in the last 72 hours. Thyroid function studies: No results for input(s): TSH, T4TOTAL, T3FREE, THYROIDAB in the last 72 hours.  Invalid input(s): FREET3 Anemia work up: No results for input(s): VITAMINB12, FOLATE, FERRITIN, TIBC, IRON, RETICCTPCT in the last 72 hours. Sepsis Labs: Recent Labs  Lab 01/02/21 1135 01/04/21 0826 01/05/21 1107  WBC 18.5* 18.2* 18.9*    Microbiology Recent Results (from the past 240 hour(s))  Resp Panel by RT-PCR (Flu A&B, Covid) Nasopharyngeal Swab     Status: None   Collection Time: 01/02/21  7:48 PM   Specimen: Nasopharyngeal Swab; Nasopharyngeal(NP) swabs in vial transport medium  Result Value Ref Range Status   SARS Coronavirus 2 by RT PCR NEGATIVE NEGATIVE Final    Comment: (NOTE) SARS-CoV-2 target nucleic acids are NOT DETECTED.  The SARS-CoV-2 RNA is generally detectable in upper respiratory specimens during the acute phase of infection. The lowest concentration of SARS-CoV-2 viral copies this assay can detect is 138 copies/mL. A negative result does not preclude SARS-Cov-2 infection and should not be used as the sole basis for treatment or other patient management decisions. A negative result may occur with  improper specimen collection/handling, submission of specimen other than nasopharyngeal swab, presence of viral mutation(s) within the areas targeted by this assay, and inadequate number of viral copies(<138 copies/mL). A negative result must be combined with clinical observations, patient history, and epidemiological information. The expected result is Negative.  Fact Sheet for Patients:   BloggerCourse.comhttps://www.fda.gov/media/152166/download  Fact Sheet for Healthcare Providers:  SeriousBroker.ithttps://www.fda.gov/media/152162/download  This test is no t yet approved or cleared by the [place] FDA and  has been authorized for detection and/or diagnosis of SARS-CoV-2 by FDA under an Emergency Use Authorization (EUA). This EUA will remain  in effect (meaning this test can be used) for the duration of the COVID-19 declaration under Section 564(b)(1) of the Act, 21 U.S.C.section 360bbb-3(b)(1), unless the authorization is terminated  or revoked sooner.       Influenza A by PCR NEGATIVE NEGATIVE Final   Influenza B by PCR NEGATIVE NEGATIVE Final    Comment: (NOTE) The Xpert Xpress SARS-CoV-2/FLU/RSV plus assay is intended as an aid in the diagnosis of influenza from Nasopharyngeal swab specimens and should not be used as a sole basis for treatment. Nasal washings and aspirates are unacceptable for Xpert Xpress SARS-CoV-2/FLU/RSV testing.  Fact Sheet for Patients: BloggerCourse.comhttps://www.fda.gov/media/152166/download  Fact Sheet for Healthcare Providers: SeriousBroker.ithttps://www.fda.gov/media/152162/download  This test is not yet approved or cleared by the [place] FDA and has been authorized for detection and/or diagnosis of SARS-CoV-2 by FDA under an Emergency Use Authorization (EUA). This EUA will remain in effect (meaning this test can be used) for the duration of the COVID-19 declaration under Section 564(b)(1) of the Act, 21 U.S.C. section 360bbb-3(b)(1), unless the authorization is terminated or revoked.  Performed at [facility], [address] [place], [place] [address]   Surgical pcr screen     Status: Abnormal   Collection Time: 01/03/21  5:38 AM   Specimen: Nasal Mucosa; Nasal Swab  Result Value Ref Range Status   MRSA, PCR NEGATIVE NEGATIVE Final   Staphylococcus aureus POSITIVE (A) NEGATIVE Final    Comment: (NOTE) The Xpert SA Assay (FDA approved for NASAL specimens in  patients 40 years of age and older), is one component of a comprehensive surveillance program. It is not intended to diagnose infection nor to guide or monitor treatment. Performed at [facility], [address] [place], [place] [address]   Aerobic/Anaerobic Culture w Gram Stain (surgical/deep wound)     Status: None (Preliminary result)   Collection Time: 01/03/21  4:26 PM   Specimen: Abscess  Result Value Ref Range Status   Specimen Description ABSCESS  Final   Special Requests PERIRECTAL ABSCESS SPEC A  Final   Gram Stain PENDING  Incomplete   Culture   Final    CULTURE REINCUBATED FOR BETTER GROWTH HOLDING FOR POSSIBLE ANAEROBE Performed at [facility], [address] [place], [place] [address]  Report Status PENDING  Incomplete     Medications:    Chlorhexidine Gluconate Cloth  6 each Topical Q0600   ketorolac  30 mg Intravenous Q6H   mupirocin ointment  1 application Nasal BID   Continuous Infusions:  cefTRIAXone (ROCEPHIN)  IV 2 g (01/06/21 0026)   metronidazole 500 mg (01/06/21 0103)      LOS: 3 days   Marinda Elk  [facility]  01/06/2021, 7:29 AM

## 2021-01-06 NOTE — Progress Notes (Addendum)
Name: Trevor Dixon MRN: [id number] DOB: Apr 10, 1981    ADMISSION DATE:  01/02/2021 CONSULTATION DATE:  01/06/2021   REFERRING MD :  Rochele Raring  CHIEF COMPLAINT: Hemoptysis  BRIEF PATIENT DESCRIPTION: 40 year old never smoker admitted with perirectal abscess status post I&D, developed new onset hemoptysis and hypoxia. Spanish-speaking.    I&D of abscess under general anesthesia, procedure uneventful, grade 1 airway noted by anesthesia and ETT to obtain on first attempt.  He developed hemoptysis, reports started right after surgical procedure, coughing all night, bringing up blood. He required increasing oxygen by nasal cannula to 5 L currently to maintain saturation 90% and above.  Drinks ETOH, never had withdrawal.   SIGNIFICANT EVENTS  7/15 I&D of perianal abscess 7/16 transferred to progressive care for hypoxia and hemoptysis 7/18 Hemoptysis slowed, hgb stable   STUDIES:  CT abdomen/pelvis 7/14 2.9 cm perianal abscess, right lower lobe pulmonary nodule and 4 mm left lower lobe nodule CT chest with contrast 7/16 diffuse bilateral perihilar airspace disease    SUBJECTIVE:  Continues to cough, little to no hemoptysis.  Reports it is mostly "spit".  Denies chest pain.   Interpreter # [phone] utilized for interview.    VITAL SIGNS: Temp:  [98.2 F (36.8 C)-98.9 F (37.2 C)] 98.5 F (36.9 C) (07/18 0827) Pulse Rate:  [76-94] 94 (07/18 0827) Resp:  [21-38] 22 (07/18 0230) BP: (100-119)/(69-71) 119/71 (07/18 0827) SpO2:  [97 %-99 %] 99 % (07/18 0827)  PHYSICAL EXAMINATION: General:  adult male lying in bed in NAD  HEENT: MM pink/moist, [place] O2, anicteric.   Neuro: AAOx4, speech clear, MAE CV: s1s2 RRR, no m/r/g PULM: non-labored at rest, dry cough / non-productive while in room, O2 weaned to 6L and sats remained >96%, crackles bilaterally  GI: soft, bsx4 active  Extremities: warm/dry, no edema  Skin: no rashes or lesions   Recent Labs  Lab 01/02/21 1135 01/03/21 0246  01/05/21 1804  NA 133* 131* 134*  K 4.3 3.6 3.5  CL 100 102 103  CO2 23 22 20*  BUN <5* 6 19  CREATININE 0.78 0.72 0.84  GLUCOSE 114* 112* 150*   Recent Labs  Lab 01/02/21 1135 01/04/21 0826 01/05/21 1107  HGB 14.7 12.2* 12.9*  HCT 42.9 35.8* 39.4  WBC 18.5* 18.2* 18.9*  PLT 296 256 307   DG CHEST PORT 1 VIEW  Result Date: 01/06/2021 CLINICAL DATA:  Follow-up pneumonia EXAM: PORTABLE CHEST 1 VIEW COMPARISON:  01/04/2021 FINDINGS: Cardiac shadow is stable. Diffuse bilateral airspace opacities are again identified throughout both lungs worsened in the interval from the prior exam. No sizable effusion is seen. No pneumothorax is noted. No bony abnormality is noted. IMPRESSION: Increasing airspace opacity bilaterally Electronically Signed   By: Alcide Clever M.D.   On: 01/06/2021 08:43    ASSESSMENT / PLAN:  New onset hemoptysis  Seems to be completely unrelated to his admitting diagnosis of perianal abscess.  CT chest with diffuse perihilar airspace disease more confluent in upper lobes and patchy in lower lobes. He did have question of possible epistaxis as well with dried blood around his left nostril but ENT evaluation was negative. Differential includes acute aspiration of blood from epistasis, negative pressure pulmonary edema with intubation, less likely DAH. His O2 needs have significantly improved which would lend to neg pressure edema.   -monitor hemoptysis, appears to be slowing  -concern for possible aspiration of blood, decreasing amts of bloody sputum / improving  -if further bleeding, consider FOB to localize area  of bleeding -if does not clear / improve, could consider further work up for [facility], but not necessary at this time  -assess ambulatory O2 needs prior to discharge -follow up PCXR to ensure resolution   Acute hypoxic respiratory failure  HFNC, related to above.  Needs improving.  -wean O2 for sats >90% -mobilize / PT / OT  -add tussionex to suppress cough       PCCM will be available PRN. Please call back if new needs arise.   Canary Brim, MSN, APRN, NP-C, AGACNP-BC [facility] 01/06/2021, 1:58 PM   Please see [email or web address] for pager details.   From 7A-7P if no response, please call [phone] After hours, please call ELink [phone]

## 2021-01-06 NOTE — Plan of Care (Signed)

## 2021-01-07 LAB — CBC WITH DIFFERENTIAL/PLATELET
Abs Immature Granulocytes: 0.07 10*3/uL (ref 0.00–0.07)
Basophils Absolute: 0.1 10*3/uL (ref 0.0–0.1)
Basophils Relative: 0 %
Eosinophils Absolute: 0.3 10*3/uL (ref 0.0–0.5)
Eosinophils Relative: 3 %
HCT: 33.6 % — ABNORMAL LOW (ref 39.0–52.0)
Hemoglobin: 11.4 g/dL — ABNORMAL LOW (ref 13.0–17.0)
Immature Granulocytes: 1 %
Lymphocytes Relative: 20 %
Lymphs Abs: 2.5 10*3/uL (ref 0.7–4.0)
MCH: 31.5 pg (ref 26.0–34.0)
MCHC: 33.9 g/dL (ref 30.0–36.0)
MCV: 92.8 fL (ref 80.0–100.0)
Monocytes Absolute: 0.8 10*3/uL (ref 0.1–1.0)
Monocytes Relative: 6 %
Neutro Abs: 8.6 10*3/uL — ABNORMAL HIGH (ref 1.7–7.7)
Neutrophils Relative %: 70 %
Platelets: 307 10*3/uL (ref 150–400)
RBC: 3.62 MIL/uL — ABNORMAL LOW (ref 4.22–5.81)
RDW: 12.9 % (ref 11.5–15.5)
WBC: 12.3 10*3/uL — ABNORMAL HIGH (ref 4.0–10.5)
nRBC: 0 % (ref 0.0–0.2)

## 2021-01-07 MED ORDER — SODIUM CHLORIDE 0.9 % IV SOLN
2.0000 g | INTRAVENOUS | Status: AC
  Administered 2021-01-07 – 2021-01-08 (×2): 2 g via INTRAVENOUS
  Filled 2021-01-07 (×2): qty 20

## 2021-01-07 MED ORDER — METRONIDAZOLE 500 MG/100ML IV SOLN
500.0000 mg | Freq: Three times a day (TID) | INTRAVENOUS | Status: AC
  Administered 2021-01-07 – 2021-01-08 (×6): 500 mg via INTRAVENOUS
  Filled 2021-01-07 (×6): qty 100

## 2021-01-07 NOTE — Progress Notes (Signed)
[facility] PROGRESS NOTE    Progress Note  Cortavious Nix  YOV:[id number] DOB: December 21, 1980 DOA: 01/02/2021 PCP: System, Provider Not In     Brief Narrative:   Bucky Grigg is an 40 y.o. male no significant past medical history presents with constipation and right buttock pain CT scan of the abdomen and pelvis was concerning for perirectal abscess.  General surgery was consulted we will start empirically on I Rocephin and Flagyl.  Significant studies: 01/02/2021 CT scan of the abdomen and pelvis shows subtle hypoattenuation of the deep pelvis suspicious for perirectal infection or developing abscess. 01/03/2021 abdominal ultrasound showed fatty liver.  Assessment/Plan:   Sepsis probably secondary to perirectal abscess: Status post I&D on 01/04/2021, by general surgery appreciate assistance. Continue IV Rocephin and Flagyl for total 7 days. Hemoglobin continues to be relatively stable.  Elevate LFTs: Endorses drinking 24 ounces beer daily right upper quadrant ultrasound showed fatty liver no acute findings.  Acute hypoxic resp failure due to hemoptysis: Question traumatic intubation, CT no tear or perforaion. Diffuse bilateral airspace disease. Oxygen requirements improved, he is requiring anywhere from 6 to 10 L high flow nasal cannula. Still requiring 15 L High flow nasal canula. ENT found no source on nasal cavity. Appears to be comfortable. Antibiotics are for perirectal abscess.    DVT prophylaxis:SCD Family Communication:none Status is: Observation  The patient will require care spanning > 2 midnights and should be moved to inpatient because: Hemodynamically unstable  Dispo: The patient is from: Home              Anticipated d/c is to: Home              Patient currently is not medically stable to d/c.   Difficult to place patient No   Code Status:     Code Status Orders  (From admission, onward)           Start     Ordered   01/02/21 2217  Full  code  Continuous        01/02/21 2218           Code Status History     This patient has a current code status but no historical code status.         IV Access:   Peripheral IV   Procedures and diagnostic studies:   DG CHEST PORT 1 VIEW  Result Date: 01/06/2021 CLINICAL DATA:  Follow-up pneumonia EXAM: PORTABLE CHEST 1 VIEW COMPARISON:  01/04/2021 FINDINGS: Cardiac shadow is stable. Diffuse bilateral airspace opacities are again identified throughout both lungs worsened in the interval from the prior exam. No sizable effusion is seen. No pneumothorax is noted. No bony abnormality is noted. IMPRESSION: Increasing airspace opacity bilaterally Electronically Signed   By: Alcide Clever M.D.   On: 01/06/2021 08:43   ECHOCARDIOGRAM COMPLETE  Result Date: 01/06/2021    ECHOCARDIOGRAM REPORT   Patient Name:   MERRELL BORSUK Date of Exam: 01/06/2021 Medical Rec #:  [id number]        Height:       66.0 in Accession #:    [id number]       Weight:       150.0 lb Date of Birth:  02/21/1981         BSA:          1.770 m Patient Age:    40 years         BP:  119/71 mmHg Patient Gender: M                HR:           88 bpm. Exam Location:  Inpatient Procedure: 2D Echo, 3D Echo, Cardiac Doppler and Color Doppler Indications:    Hypoxia  History:        Patient has no prior history of Echocardiogram examinations.                 Signs/Symptoms:Dyspnea and Shortness of Breath. ETOH.  Sonographer:    Sheralyn Boatman RDCS Referring Phys: [phone] PAULA B SIMPSON IMPRESSIONS  1. Left ventricular ejection fraction, by estimation, is 55 to 60%. The left ventricle has normal function. The left ventricle has no regional wall motion abnormalities. Left ventricular diastolic parameters were normal.  2. Right ventricular systolic function is normal. The right ventricular size is normal.  3. The mitral valve is normal in structure. No evidence of mitral valve regurgitation. No evidence of mitral stenosis.  4. The  aortic valve is normal in structure. Aortic valve regurgitation is not visualized. No aortic stenosis is present.  5. The inferior vena cava is normal in size with greater than 50% respiratory variability, suggesting right atrial pressure of 3 mmHg. FINDINGS  Left Ventricle: Left ventricular ejection fraction, by estimation, is 55 to 60%. The left ventricle has normal function. The left ventricle has no regional wall motion abnormalities. The left ventricular internal cavity size was normal in size. There is  no left ventricular hypertrophy. Left ventricular diastolic parameters were normal. Right Ventricle: The right ventricular size is normal. No increase in right ventricular wall thickness. Right ventricular systolic function is normal. Left Atrium: Left atrial size was normal in size. Right Atrium: Right atrial size was normal in size. Pericardium: There is no evidence of pericardial effusion. Mitral Valve: The mitral valve is normal in structure. No evidence of mitral valve regurgitation. No evidence of mitral valve stenosis. Tricuspid Valve: The tricuspid valve is normal in structure. Tricuspid valve regurgitation is not demonstrated. No evidence of tricuspid stenosis. Aortic Valve: The aortic valve is normal in structure. Aortic valve regurgitation is not visualized. No aortic stenosis is present. Pulmonic Valve: The pulmonic valve was normal in structure. Pulmonic valve regurgitation is not visualized. No evidence of pulmonic stenosis. Aorta: The aortic root is normal in size and structure. Venous: The inferior vena cava is normal in size with greater than 50% respiratory variability, suggesting right atrial pressure of 3 mmHg. IAS/Shunts: No atrial level shunt detected by color flow Doppler.  LEFT VENTRICLE PLAX 2D LVIDd:         4.90 cm      Diastology LVIDs:         3.60 cm      LV e' medial:    8.59 cm/s LV PW:         1.30 cm      LV E/e' medial:  13.0 LV IVS:        1.00 cm      LV e' lateral:   12.70  cm/s LVOT diam:     1.70 cm      LV E/e' lateral: 8.8 LV SV:         47 LV SV Index:   27 LVOT Area:     2.27 cm  LV Volumes (MOD) LV vol d, MOD A2C: 98.8 ml LV vol d, MOD A4C: 100.0 ml LV vol s, MOD A2C: 38.4 ml LV vol  s, MOD A4C: 43.5 ml LV SV MOD A2C:     60.4 ml LV SV MOD A4C:     100.0 ml LV SV MOD BP:      59.6 ml RIGHT VENTRICLE             IVC RV S prime:     10.70 cm/s  IVC diam: 2.10 cm TAPSE (M-mode): 1.7 cm LEFT ATRIUM             Index       RIGHT ATRIUM           Index LA diam:        3.10 cm 1.75 cm/m  RA Area:     16.10 cm LA Vol (A2C):   22.3 ml 12.60 ml/m RA Volume:   43.50 ml  24.58 ml/m LA Vol (A4C):   29.9 ml 16.90 ml/m LA Biplane Vol: 26.3 ml 14.86 ml/m  AORTIC VALVE LVOT Vmax:   103.00 cm/s LVOT Vmean:  68.400 cm/s LVOT VTI:    0.209 m  AORTA Ao Root diam: 2.90 cm Ao Asc diam:  2.70 cm MITRAL VALVE MV Area (PHT): 8.25 cm     SHUNTS MV Decel Time: 92 msec      Systemic VTI:  0.21 m MV E velocity: 112.00 cm/s  Systemic Diam: 1.70 cm MV A velocity: 61.80 cm/s MV E/A ratio:  1.81 Donato SchultzMark Skains MD Electronically signed by Donato SchultzMark Skains MD Signature Date/Time: 01/06/2021/12:58:10 PM    Final      Medical Consultants:   None.   Subjective:    Maxine GlennFrancisco Cherubini relates his breathing is better.  Objective:    Vitals:   01/06/21 2155 01/06/21 2300 01/07/21 0356 01/07/21 0510  BP:  137/69    Pulse:      Resp:      Temp:  98.3 F (36.8 C) 98.6 F (37 C)   TempSrc:  Oral Oral   SpO2: 93%   92%  Weight:      Height:       SpO2: 92 % O2 Flow Rate (L/min): 10 L/min FiO2 (%): 40 %   Intake/Output Summary (Last 24 hours) at 01/07/2021 0700 Last data filed at 01/06/2021 2034 Gross per 24 hour  Intake --  Output 2 ml  Net -2 ml    Filed Weights   01/02/21 1134  Weight: 68 kg    Exam: General exam: In no acute distress. Respiratory system: Good air movement and crackles at bases bilaterally Cardiovascular system: S1 & S2 heard, RRR. No JVD. Gastrointestinal  system: Abdomen is nondistended, soft and nontender.  Extremities: No pedal edema. Skin: No rashes, lesions or ulcers  Data Reviewed:    Labs: Basic Metabolic Panel: Recent Labs  Lab 01/02/21 1135 01/03/21 0246 01/05/21 1804  NA 133* 131* 134*  K 4.3 3.6 3.5  CL 100 102 103  CO2 23 22 20*  GLUCOSE 114* 112* 150*  BUN <5* 6 19  CREATININE 0.78 0.72 0.84  CALCIUM 9.1 8.5* 7.8*    GFR Estimated Creatinine Clearance: 105.5 mL/min (by C-G formula based on SCr of 0.84 mg/dL). Liver Function Tests: Recent Labs  Lab 01/02/21 1135 01/03/21 0246  AST 135* 99*  ALT 59* 45*  ALKPHOS 131* 117  BILITOT 1.1 0.9  PROT 8.6* 7.9  ALBUMIN 3.5 3.0*    Recent Labs  Lab 01/02/21 1135  LIPASE 35    No results for input(s): AMMONIA in the last 168 hours. Coagulation profile Recent Labs  Lab 01/03/21 0246  INR 1.3*    COVID-19 Labs  No results for input(s): DDIMER, FERRITIN, LDH, CRP in the last 72 hours.  Lab Results  Component Value Date   SARSCOV2NAA NEGATIVE 01/02/2021    CBC: Recent Labs  Lab 01/02/21 1135 01/04/21 0826 01/05/21 1107 01/07/21 0544  WBC 18.5* 18.2* 18.9* 12.3*  NEUTROABS  --   --   --  PENDING  HGB 14.7 12.2* 12.9* 11.4*  HCT 42.9 35.8* 39.4 33.6*  MCV 92.7 93.2 96.1 92.8  PLT 296 256 307 307    Cardiac Enzymes: No results for input(s): CKTOTAL, CKMB, CKMBINDEX, TROPONINI in the last 168 hours. BNP (last 3 results) No results for input(s): PROBNP in the last 8760 hours. CBG: No results for input(s): GLUCAP in the last 168 hours. D-Dimer: No results for input(s): DDIMER in the last 72 hours. Hgb A1c: No results for input(s): HGBA1C in the last 72 hours. Lipid Profile: No results for input(s): CHOL, HDL, LDLCALC, TRIG, CHOLHDL, LDLDIRECT in the last 72 hours. Thyroid function studies: No results for input(s): TSH, T4TOTAL, T3FREE, THYROIDAB in the last 72 hours.  Invalid input(s): FREET3 Anemia work up: No results for input(s):  VITAMINB12, FOLATE, FERRITIN, TIBC, IRON, RETICCTPCT in the last 72 hours. Sepsis Labs: Recent Labs  Lab 01/02/21 1135 01/04/21 0826 01/05/21 1107 01/07/21 0544  WBC 18.5* 18.2* 18.9* 12.3*    Microbiology Recent Results (from the past 240 hour(s))  Resp Panel by RT-PCR (Flu A&B, Covid) Nasopharyngeal Swab     Status: None   Collection Time: 01/02/21  7:48 PM   Specimen: Nasopharyngeal Swab; Nasopharyngeal(NP) swabs in vial transport medium  Result Value Ref Range Status   SARS Coronavirus 2 by RT PCR NEGATIVE NEGATIVE Final    Comment: (NOTE) SARS-CoV-2 target nucleic acids are NOT DETECTED.  The SARS-CoV-2 RNA is generally detectable in upper respiratory specimens during the acute phase of infection. The lowest concentration of SARS-CoV-2 viral copies this assay can detect is 138 copies/mL. A negative result does not preclude SARS-Cov-2 infection and should not be used as the sole basis for treatment or other patient management decisions. A negative result may occur with  improper specimen collection/handling, submission of specimen other than nasopharyngeal swab, presence of viral mutation(s) within the areas targeted by this assay, and inadequate number of viral copies(<138 copies/mL). A negative result must be combined with clinical observations, patient history, and epidemiological information. The expected result is Negative.  Fact Sheet for Patients:  BloggerCourse.com  Fact Sheet for Healthcare Providers:  SeriousBroker.it  This test is no t yet approved or cleared by the [place] FDA and  has been authorized for detection and/or diagnosis of SARS-CoV-2 by FDA under an Emergency Use Authorization (EUA). This EUA will remain  in effect (meaning this test can be used) for the duration of the COVID-19 declaration under Section 564(b)(1) of the Act, 21 U.S.C.section 360bbb-3(b)(1), unless the authorization is  terminated  or revoked sooner.       Influenza A by PCR NEGATIVE NEGATIVE Final   Influenza B by PCR NEGATIVE NEGATIVE Final    Comment: (NOTE) The Xpert Xpress SARS-CoV-2/FLU/RSV plus assay is intended as an aid in the diagnosis of influenza from Nasopharyngeal swab specimens and should not be used as a sole basis for treatment. Nasal washings and aspirates are unacceptable for Xpert Xpress SARS-CoV-2/FLU/RSV testing.  Fact Sheet for Patients: BloggerCourse.com  Fact Sheet for Healthcare Providers: SeriousBroker.it  This test is not yet approved or cleared  by the [place] and has been authorized for detection and/or diagnosis of SARS-CoV-2 by FDA under an Emergency Use Authorization (EUA). This EUA will remain in effect (meaning this test can be used) for the duration of the COVID-19 declaration under Section 564(b)(1) of the Act, 21 U.S.C. section 360bbb-3(b)(1), unless the authorization is terminated or revoked.  Performed at [facility], [address], [place], [place] [address]   Surgical pcr screen     Status: Abnormal   Collection Time: 01/03/21  5:38 AM   Specimen: Nasal Mucosa; Nasal Swab  Result Value Ref Range Status   MRSA, PCR NEGATIVE NEGATIVE Final   Staphylococcus aureus POSITIVE (A) NEGATIVE Final    Comment: (NOTE) The Xpert SA Assay (FDA approved for NASAL specimens in patients 78 years of age and older), is one component of a comprehensive surveillance program. It is not intended to diagnose infection nor to guide or monitor treatment. Performed at [facility], [address], [place], [place] [address]   Aerobic/Anaerobic Culture w Gram Stain (surgical/deep wound)     Status: None   Collection Time: 01/03/21  4:26 PM   Specimen: Abscess  Result Value Ref Range Status   Specimen Description ABSCESS  Final   Special Requests PERIRECTAL ABSCESS SPEC A  Final   Gram Stain    Final    RARE WBC PRESENT,BOTH PMN AND MONONUCLEAR FEW GRAM NEGATIVE RODS    Culture   Final    FEW ESCHERICHIA COLI FEW BACTEROIDES OVATUS FEW BACTEROIDES THETAIOTAOMICRON BETA LACTAMASE POSITIVE Performed at [facility], [address], [place], [place] [address]    Report Status 01/06/2021 FINAL  Final   Organism ID, Bacteria ESCHERICHIA COLI  Final      Susceptibility   Escherichia coli - MIC*    AMPICILLIN <=2 SENSITIVE Sensitive     CEFAZOLIN <=4 SENSITIVE Sensitive     CEFEPIME <=0.12 SENSITIVE Sensitive     CEFTAZIDIME <=1 SENSITIVE Sensitive     CEFTRIAXONE <=0.25 SENSITIVE Sensitive     CIPROFLOXACIN <=0.25 SENSITIVE Sensitive     GENTAMICIN <=1 SENSITIVE Sensitive     IMIPENEM <=0.25 SENSITIVE Sensitive     TRIMETH/SULFA <=20 SENSITIVE Sensitive     AMPICILLIN/SULBACTAM <=2 SENSITIVE Sensitive     PIP/TAZO <=4 SENSITIVE Sensitive     * FEW ESCHERICHIA COLI     Medications:    Chlorhexidine Gluconate Cloth  6 each Topical Q0600   chlorpheniramine-HYDROcodone  5 mL Oral Q12H   ketorolac  30 mg Intravenous Q6H   mupirocin ointment  1 application Nasal BID   tranexamic acid  500 mg Nebulization Q8H   Continuous Infusions:  cefTRIAXone (ROCEPHIN)  IV 2 g (01/06/21 2333)   metronidazole 500 mg (01/07/21 0106)      LOS: 4 days   Marinda Elk  [facility]  01/07/2021, 7:00 AM

## 2021-01-08 ENCOUNTER — Inpatient Hospital Stay (HOSPITAL_COMMUNITY): Payer: Self-pay

## 2021-01-08 DIAGNOSIS — A4151 Sepsis due to Escherichia coli [E. coli]: Secondary | ICD-10-CM

## 2021-01-08 LAB — BASIC METABOLIC PANEL
Anion gap: 6 (ref 5–15)
BUN: 13 mg/dL (ref 6–20)
CO2: 22 mmol/L (ref 22–32)
Calcium: 8.3 mg/dL — ABNORMAL LOW (ref 8.9–10.3)
Chloride: 108 mmol/L (ref 98–111)
Creatinine, Ser: 0.64 mg/dL (ref 0.61–1.24)
GFR, Estimated: >60 mL/min (ref >60)
Glucose, Bld: 88 mg/dL (ref 70–99)
Potassium: 3.8 mmol/L (ref 3.5–5.1)
Sodium: 136 mmol/L (ref 135–145)

## 2021-01-08 LAB — CBC
HCT: 36.3 % — ABNORMAL LOW (ref 39.0–52.0)
Hemoglobin: 11.9 g/dL — ABNORMAL LOW (ref 13.0–17.0)
MCH: 30.7 pg (ref 26.0–34.0)
MCHC: 32.8 g/dL (ref 30.0–36.0)
MCV: 93.8 fL (ref 80.0–100.0)
Platelets: 332 10*3/uL (ref 150–400)
RBC: 3.87 MIL/uL — ABNORMAL LOW (ref 4.22–5.81)
RDW: 13.1 % (ref 11.5–15.5)
WBC: 11.2 10*3/uL — ABNORMAL HIGH (ref 4.0–10.5)
nRBC: 0 % (ref 0.0–0.2)

## 2021-01-08 MED ORDER — FUROSEMIDE 10 MG/ML IJ SOLN
40.0000 mg | Freq: Once | INTRAMUSCULAR | Status: AC
  Administered 2021-01-08: 40 mg via INTRAVENOUS

## 2021-01-08 MED ORDER — FUROSEMIDE 10 MG/ML IJ SOLN
INTRAMUSCULAR | Status: AC
  Filled 2021-01-08: qty 4

## 2021-01-08 NOTE — Progress Notes (Signed)
PROGRESS NOTE    Trevor Dixon  FTD:[id number] DOB: Oct 24, 1980 DOA: 01/02/2021 PCP: System, Provider Not In   Brief Narrative: Trevor Dixon is a 40 y.o. male with no past medical history.  Patient presented secondary to constipation and right buttock pain was found to have a perirectal abscess.  General surgery was consulted and performed an I&D on 7/15.  Patient developed hemoptysis with associated respiratory failure but is now improving with IV Lasix.  Unknown certain etiology of his hemoptysis.   Assessment & Plan:   Principal Problem:   Sepsis (HCC) Active Problems:   Rectal pain   LFT elevation   Alcohol use   Acute respiratory failure with hypoxia (HCC)   Hemoptysis   Sepsis Present on admission. Secondary to perirectal abscess. Management below.  Perirectal abscess Patient started empirically on Zosyn, transitioned to Ceftriaxone and Flagyl. General surgery consulted and performed I&D on 7/15; culture significant for E. Coli which was pan-sensitive. Patient continued on Ceftriaxone and flagyl -Discontinue Flagyl -Continue Ceftriaxone today  Acute respiratory failure with hypoxia Secondary to hemoptysis. Patient has needed as much as 15 lpm via [facility]. CT chest scan significant for diffuse airspace disease concerning for possible pulmonary edema. Patient has had improvement with Lasix. Transthoracic Echocardiogram without LV dysfunction. -Continue Lasix IV dosed daily -PT/OT eval  Hemoptysis Unknown etiology. Pulmonology consulted. Initially thought secondary to epistaxis, however ENT was consulted and bedside scope was negative for bleeding source. Symptoms improved but still with streaking blood in sputum. Pulmonology considered bronchoscopy but deferred at this time. Hemoglobin is stable. -Daily CBC   DVT prophylaxis: SCDs Code Status:   Code Status: Full Code Family Communication: None at bedside Disposition Plan: Discharge home when able to wean oxygen in  addition to PT/OT recommendations   Consultants:  PCCM Otolaryngology  Procedures:  TRANSTHORACIC ECHOCARDIOGRAM (01/06/2021) IMPRESSIONS     1. Left ventricular ejection fraction, by estimation, is 55 to 60%. The  left ventricle has normal function. The left ventricle has no regional  wall motion abnormalities. Left ventricular diastolic parameters were  normal.   2. Right ventricular systolic function is normal. The right ventricular  size is normal.   3. The mitral valve is normal in structure. No evidence of mitral valve  regurgitation. No evidence of mitral stenosis.   4. The aortic valve is normal in structure. Aortic valve regurgitation is  not visualized. No aortic stenosis is present.   5. The inferior vena cava is normal in size with greater than 50%  respiratory variability, suggesting right atrial pressure of 3 mmHg.  LARYNGOSCOPY (01/04/2021)  Antimicrobials: Vancomycin Zosyn Ceftriaxone Flagyl    Subjective:  Spanish interpreter: [name]  Patient reports episodes of involuntary arm raising and shaking that are transient. He reports continued hemoptysis and describes it as blood within the phlegm.  Objective: Vitals:   01/07/21 1600 01/07/21 1826 01/07/21 1937 01/08/21 0800  BP: (!) 91/44 112/63 115/61 139/82  Pulse: 74  70 72  Resp: 20  (!) 23 (!) 29  Temp: 97.8 F (36.6 C)  98.3 F (36.8 C) 98.7 F (37.1 C)  TempSrc: Oral  Oral Oral  SpO2: 92%  95% 94%  Weight:      Height:        Intake/Output Summary (Last 24 hours) at 01/08/2021 1118 Last data filed at 01/08/2021 0623 Gross per 24 hour  Intake 100 ml  Output 1000 ml  Net -900 ml   Filed Weights   01/02/21 1134  Weight: 68  kg    Examination:  General exam: Appears calm and comfortable and in no acute distress. Conversant Respiratory: Rales. Respiratory effort normal with no intercostal retractions or use of accessory muscles Cardiovascular: S1 & S2 heard, RRR. No murmurs,  rubs, gallops or clicks. No edema Gastrointestinal: Abdomen is nondistended, soft and nontender. No masses felt. Normal bowel sounds heard Neurologic: No focal neurological deficits Musculoskeletal: No calf tenderness Skin: No cyanosis. No new rashes Psychiatry: Alert and oriented. Memory intact. Mood & affect appropriate    Data Reviewed: I have personally reviewed following labs and imaging studies  CBC Lab Results  Component Value Date   WBC 11.2 (H) 01/08/2021   RBC 3.87 (L) 01/08/2021   HGB 11.9 (L) 01/08/2021   HCT 36.3 (L) 01/08/2021   MCV 93.8 01/08/2021   MCH 30.7 01/08/2021   PLT 332 01/08/2021   MCHC 32.8 01/08/2021   RDW 13.1 01/08/2021   LYMPHSABS 2.5 01/07/2021   MONOABS 0.8 01/07/2021   EOSABS 0.3 01/07/2021   BASOSABS 0.1 01/07/2021     Last metabolic panel Lab Results  Component Value Date   NA 136 01/08/2021   K 3.8 01/08/2021   CL 108 01/08/2021   CO2 22 01/08/2021   BUN 13 01/08/2021   CREATININE 0.64 01/08/2021   GLUCOSE 88 01/08/2021   GFRNONAA >60 01/08/2021   GFRAA >60 07/14/2016   CALCIUM 8.3 (L) 01/08/2021   PROT 7.9 01/03/2021   ALBUMIN 3.0 (L) 01/03/2021   BILITOT 0.9 01/03/2021   ALKPHOS 117 01/03/2021   AST 99 (H) 01/03/2021   ALT 45 (H) 01/03/2021   ANIONGAP 6 01/08/2021    CBG (last 3)  No results for input(s): GLUCAP in the last 72 hours.   GFR: Estimated Creatinine Clearance: 110.8 mL/min (by C-G formula based on SCr of 0.64 mg/dL).  Coagulation Profile: Recent Labs  Lab 01/03/21 0246  INR 1.3*    Recent Results (from the past 240 hour(s))  Resp Panel by RT-PCR (Flu A&B, Covid) Nasopharyngeal Swab     Status: None   Collection Time: 01/02/21  7:48 PM   Specimen: Nasopharyngeal Swab; Nasopharyngeal(NP) swabs in vial transport medium  Result Value Ref Range Status   SARS Coronavirus 2 by RT PCR NEGATIVE NEGATIVE Final    Comment: (NOTE) SARS-CoV-2 target nucleic acids are NOT DETECTED.  The SARS-CoV-2 RNA is  generally detectable in upper respiratory specimens during the acute phase of infection. The lowest concentration of SARS-CoV-2 viral copies this assay can detect is 138 copies/mL. A negative result does not preclude SARS-Cov-2 infection and should not be used as the sole basis for treatment or other patient management decisions. A negative result may occur with  improper specimen collection/handling, submission of specimen other than nasopharyngeal swab, presence of viral mutation(s) within the areas targeted by this assay, and inadequate number of viral copies(<138 copies/mL). A negative result must be combined with clinical observations, patient history, and epidemiological information. The expected result is Negative.  Fact Sheet for Patients:  BloggerCourse.com  Fact Sheet for Healthcare Providers:  SeriousBroker.it  This test is no t yet approved or cleared by the [place] FDA and  has been authorized for detection and/or diagnosis of SARS-CoV-2 by FDA under an Emergency Use Authorization (EUA). This EUA will remain  in effect (meaning this test can be used) for the duration of the COVID-19 declaration under Section 564(b)(1) of the Act, 21 U.S.C.section 360bbb-3(b)(1), unless the authorization is terminated  or revoked sooner.  Influenza A by PCR NEGATIVE NEGATIVE Final   Influenza B by PCR NEGATIVE NEGATIVE Final    Comment: (NOTE) The Xpert Xpress SARS-CoV-2/FLU/RSV plus assay is intended as an aid in the diagnosis of influenza from Nasopharyngeal swab specimens and should not be used as a sole basis for treatment. Nasal washings and aspirates are unacceptable for Xpert Xpress SARS-CoV-2/FLU/RSV testing.  Fact Sheet for Patients: BloggerCourse.com  Fact Sheet for Healthcare Providers: SeriousBroker.it  This test is not yet approved or cleared by the [place] FDA and has been authorized for detection and/or diagnosis of SARS-CoV-2 by FDA under an Emergency Use Authorization (EUA). This EUA will remain in effect (meaning this test can be used) for the duration of the COVID-19 declaration under Section 564(b)(1) of the Act, 21 U.S.C. section 360bbb-3(b)(1), unless the authorization is terminated or revoked.  Performed at [facility], [address] [place], [place] [address]   Surgical pcr screen     Status: Abnormal   Collection Time: 01/03/21  5:38 AM   Specimen: Nasal Mucosa; Nasal Swab  Result Value Ref Range Status   MRSA, PCR NEGATIVE NEGATIVE Final   Staphylococcus aureus POSITIVE (A) NEGATIVE Final    Comment: (NOTE) The Xpert SA Assay (FDA approved for NASAL specimens in patients 40 years of age and older), is one component of a comprehensive surveillance program. It is not intended to diagnose infection nor to guide or monitor treatment. Performed at [facility], [address], [place], [place] [address]   Aerobic/Anaerobic Culture w Gram Stain (surgical/deep wound)     Status: None   Collection Time: 01/03/21  4:26 PM   Specimen: Abscess  Result Value Ref Range Status   Specimen Description ABSCESS  Final   Special Requests PERIRECTAL ABSCESS SPEC A  Final   Gram Stain   Final    RARE WBC PRESENT,BOTH PMN AND MONONUCLEAR FEW GRAM NEGATIVE RODS    Culture   Final    FEW ESCHERICHIA COLI FEW BACTEROIDES OVATUS FEW BACTEROIDES THETAIOTAOMICRON BETA LACTAMASE POSITIVE Performed at [facility], [address] [place], [place] [address]    Report Status 01/06/2021 FINAL  Final   Organism ID, Bacteria ESCHERICHIA COLI  Final      Susceptibility   Escherichia coli - MIC*    AMPICILLIN <=2 SENSITIVE Sensitive     CEFAZOLIN <=4 SENSITIVE Sensitive     CEFEPIME <=0.12 SENSITIVE Sensitive     CEFTAZIDIME <=1 SENSITIVE Sensitive     CEFTRIAXONE <=0.25 SENSITIVE Sensitive     CIPROFLOXACIN  <=0.25 SENSITIVE Sensitive     GENTAMICIN <=1 SENSITIVE Sensitive     IMIPENEM <=0.25 SENSITIVE Sensitive     TRIMETH/SULFA <=20 SENSITIVE Sensitive     AMPICILLIN/SULBACTAM <=2 SENSITIVE Sensitive     PIP/TAZO <=4 SENSITIVE Sensitive     * FEW ESCHERICHIA COLI        Radiology Studies: No results found.      Scheduled Meds:  chlorpheniramine-HYDROcodone  5 mL Oral Q12H   Continuous Infusions:  cefTRIAXone (ROCEPHIN)  IV 2 g (01/07/21 2148)   metronidazole 500 mg (01/08/21 0821)     LOS: 5 days     Jacquelin Hawking, MD [facility] 01/08/2021, 11:18 AM  If 7PM-7AM, please contact night-coverage [email or web address]

## 2021-01-09 LAB — BASIC METABOLIC PANEL
Anion gap: 6 (ref 5–15)
BUN: 7 mg/dL (ref 6–20)
CO2: 24 mmol/L (ref 22–32)
Calcium: 8.4 mg/dL — ABNORMAL LOW (ref 8.9–10.3)
Chloride: 104 mmol/L (ref 98–111)
Creatinine, Ser: 0.66 mg/dL (ref 0.61–1.24)
GFR, Estimated: >60 mL/min (ref >60)
Glucose, Bld: 91 mg/dL (ref 70–99)
Potassium: 3.6 mmol/L (ref 3.5–5.1)
Sodium: 134 mmol/L — ABNORMAL LOW (ref 135–145)

## 2021-01-09 MED ORDER — FUROSEMIDE 10 MG/ML IJ SOLN: 40.0000 mg | Freq: Once | INTRAMUSCULAR | Status: DC

## 2021-01-09 MED ORDER — FUROSEMIDE 10 MG/ML IJ SOLN
40.0000 mg | Freq: Every day | INTRAMUSCULAR | Status: DC
  Administered 2021-01-09 – 2021-01-14 (×6): 40 mg via INTRAVENOUS
  Filled 2021-01-09 (×6): qty 4

## 2021-01-09 NOTE — Evaluation (Signed)
Occupational Therapy Evaluation Patient Details Name: Trevor Dixon MRN: [id number] DOB: February 22, 1981 Today's Date: 01/09/2021    History of Present Illness 40 y.o. male presenting to ED 7/14 with constipation and R buttock pain. Patient admitted with perirectal abscess and sepsis s/p I&D 7/15. Hospital admission complicated by hemoptysis of unknown etiology with associated respiratory failure improving with IV lasix. Suspicion for possible pulmonary edema. No significant PMHx noted.   Clinical Impression   PTA patient was living with his brother in a 1st floor apartment with 2 STE and was grossly I with ADLs/IADLs including driving and working. Patient currently functioning below baseline requiring Min guard to Min A for sit to stand transfers and ADL transfers with at least unilateral UE support. Patient also limited by deficits listed below including decreased cardiopulmonary status requiring 6-8L O2 via [place], dizziness with change in position without nystagmus, and generalized weakness and would benefit from continued acute OT services in prep for safe d/c home with family. Patient reports family is able to provide 24hr supervision/assist as needed. OT will continue to follow acutely.      Follow Up Recommendations  No OT follow up    Equipment Recommendations  None recommended by OT    Recommendations for Other Services       Precautions / Restrictions Precautions Precautions: Fall Precaution Comments: Monitor SpO2 Restrictions Weight Bearing Restrictions: No      Mobility Bed Mobility Overal bed mobility: Needs Assistance Bed Mobility: Supine to Sit;Sit to Supine     Supine to sit: Min guard Sit to supine: Min guard   General bed mobility comments: Able to come to EOB with Min guard for safety/line management. Patient reporting dizziness upon sitting spontaneously returning to supine with Min guard.    Transfers Overall transfer level: Needs assistance Equipment used: 1  person hand held assist Transfers: Sit to/from [employer] Sit to Stand: Min guard Stand pivot transfers: Min guard       General transfer comment: Min guard for sit to stand from EOB with increased time secondary to dizziness. Min guard for stand-pivot to recliner with unilateral UE support on IV pole.    Balance Overall balance assessment: Needs assistance Sitting-balance support: Bilateral upper extremity supported;Feet supported Sitting balance-Leahy Scale: Fair Sitting balance - Comments: Reports dizziness upon sitting at EOB.   Standing balance support: Single extremity supported;During functional activity Standing balance-Leahy Scale: Poor Standing balance comment: Reliant on at least unilateral UE support and external assist with dynamic balance.                           ADL either performed or assessed with clinical judgement   ADL Overall ADL's : Needs assistance/impaired                 Upper Body Dressing : Set up;Sitting   Lower Body Dressing: Minimal assistance;Sit to/from stand   Toilet Transfer: Minimal assistance Toilet Transfer Details (indicate cue type and reason): Simulated with transfer to recliner with HHA +1.         Functional mobility during ADLs: Minimal assistance General ADL Comments: Patient greatly limited by dizziness with change in position (no noted nystagmus), pain, generalized weakness and decreased cardiopulmonary endurance requiring 6-8L O2 via [place] at rest and with activity.     Vision Baseline Vision/History: Wears glasses       Perception     Praxis      Pertinent Vitals/Pain Pain Assessment: 0-10  Pain Score: 7  Pain Location: Buttocks, low back, neck and chest (with coughing) Pain Descriptors / Indicators: Aching;Sore Pain Intervention(s): Limited activity within patient's tolerance;Monitored during session;Repositioned;Premedicated before session     Hand Dominance     Extremity/Trunk  Assessment Upper Extremity Assessment Upper Extremity Assessment: Generalized weakness   Lower Extremity Assessment Lower Extremity Assessment: Defer to PT evaluation   Cervical / Trunk Assessment Cervical / Trunk Assessment: Normal   Communication Communication Communication: Prefers language other than [place];Other (comment) (Virtual interpreter used for evaluation)   Cognition Arousal/Alertness: Awake/alert Behavior During Therapy: WFL for tasks assessed/performed Overall Cognitive Status: Difficult to assess                                 General Comments: A&Ox4; follows verbal commands with good accuracy.   General Comments  SpO2 88% on 6L upon entry. Titrated up to 8L with mobility in room. Unable to tolerate mask. SpO2 dropped to 84% with mask off and mobility ~97ft. Rebounds within 30 seconds. At conclusion of session, patient with SpO2 88% on 6L.    Exercises     Shoulder Instructions      Home Living Family/patient expects to be discharged to:: Private residence Living Arrangements: Other (Comment) (Brother) Available Help at Discharge: Family;Available 24 hours/day Type of Home: Apartment Home Access: Stairs to enter (1st floor) Entrance Stairs-Number of Steps: 2 Entrance Stairs-Rails: None Home Layout: One level     Bathroom Shower/Tub: Chief Strategy Officer: Standard     Home Equipment: None          Prior Functioning/Environment Level of Independence: Independent        Comments: I with ADLs/IADLs; working several days/wk        OT Problem List: Decreased activity tolerance;Impaired balance (sitting and/or standing);Cardiopulmonary status limiting activity      OT Treatment/Interventions: Self-care/ADL training;Therapeutic exercise;Energy conservation;Therapeutic activities;Patient/family education;Balance training    OT Goals(Current goals can be found in the care plan section) Acute Rehab OT Goals Patient  Stated Goal: To decrease pain. OT Goal Formulation: With patient Time For Goal Achievement: 01/23/21 Potential to Achieve Goals: Good ADL Goals Additional ADL Goal #1: Patient will complete ADLs with Mod I and SpO2 >90% on 2L O2 via [place]. Additional ADL Goal #2: Patient will recall 3 energy conservation techniques in prep for ADLs/IADLs.  OT Frequency: Min 2X/week   Barriers to D/C:            Co-evaluation PT/OT/SLP Co-Evaluation/Treatment: Yes Reason for Co-Treatment: Complexity of the patient's impairments (multi-system involvement)   OT goals addressed during session: ADL's and self-care      AM-PAC OT "6 Clicks" Daily Activity     Outcome Measure Help from another person eating meals?: None Help from another person taking care of personal grooming?: A Little Help from another person toileting, which includes using toliet, bedpan, or urinal?: A Little Help from another person bathing (including washing, rinsing, drying)?: A Lot Help from another person to put on and taking off regular upper body clothing?: A Little Help from another person to put on and taking off regular lower body clothing?: A Little 6 Click Score: 18   End of Session Equipment Utilized During Treatment: Gait belt;Oxygen Nurse Communication: Mobility status  Activity Tolerance: Patient limited by fatigue;Patient limited by pain Patient left: in chair;with call bell/phone within reach;with chair alarm set  OT Visit Diagnosis: Unsteadiness on feet (R26.81);Muscle  weakness (generalized) (M62.81)                Time: 1062-6948 OT Time Calculation (min): 33 min Charges:  OT General Charges $OT Visit: 1 Visit OT Evaluation $OT Eval Moderate Complexity: 1 Mod OT Treatments $Therapeutic Activity: 8-22 mins  Zakyah Yanes H. OTR/L Supplemental OT, Department of rehab services [phone]  Cher Egnor R H. 01/09/2021, 11:15 AM

## 2021-01-09 NOTE — Progress Notes (Signed)
Pt refused BiPAP for tonight.

## 2021-01-09 NOTE — Progress Notes (Signed)
PROGRESS NOTE    Trevor Dixon  WUJ:[id number] DOB: 1980-09-22 DOA: 01/02/2021 PCP: System, Provider Not In   Brief Narrative: Trevor Dixon is a 40 y.o. male with no past medical history.  Patient presented secondary to constipation and right buttock pain was found to have a perirectal abscess.  General surgery was consulted and performed an I&D on 7/15.  Patient developed hemoptysis with associated respiratory failure but is now improving with IV Lasix.  Unknown certain etiology of his hemoptysis.   Assessment & Plan:   Principal Problem:   Sepsis (HCC) Active Problems:   Rectal pain   LFT elevation   Alcohol use   Acute respiratory failure with hypoxia (HCC)   Hemoptysis   Sepsis Present on admission. Secondary to perirectal abscess. Management below.  Perirectal abscess Patient started empirically on Zosyn, transitioned to Ceftriaxone and Flagyl. General surgery consulted and performed I&D on 7/15; culture significant for E. Coli which was pan-sensitive. Patient continued on Ceftriaxone and flagyl and has completed course.  Acute respiratory failure with hypoxia Secondary to hemoptysis. Patient has needed as much as 15 lpm via HFNC. CT chest scan significant for diffuse airspace disease concerning for possible pulmonary edema. Patient has had improvement with Lasix. Transthoracic Echocardiogram without LV dysfunction. PT and OT recommending no follow-up. -Continue Lasix IV daily -Watch UOP -Wean oxygen to room air  Hemoptysis Unknown etiology. Pulmonology consulted. Initially thought secondary to epistaxis, however ENT was consulted and bedside scope was negative for bleeding source. Symptoms improved but still with streaking blood in sputum today. Pulmonology considered bronchoscopy but deferred at this time. Hemoglobin is stable. -CBC in AM   DVT prophylaxis: SCDs Code Status:   Code Status: Full Code Family Communication: None at bedside Disposition Plan:  Discharge home when able to wean oxygen. Likely 1-5 days   Consultants:  PCCM Otolaryngology  Procedures:  TRANSTHORACIC ECHOCARDIOGRAM (01/06/2021) IMPRESSIONS     1. Left ventricular ejection fraction, by estimation, is 55 to 60%. The  left ventricle has normal function. The left ventricle has no regional  wall motion abnormalities. Left ventricular diastolic parameters were  normal.   2. Right ventricular systolic function is normal. The right ventricular  size is normal.   3. The mitral valve is normal in structure. No evidence of mitral valve  regurgitation. No evidence of mitral stenosis.   4. The aortic valve is normal in structure. Aortic valve regurgitation is  not visualized. No aortic stenosis is present.   5. The inferior vena cava is normal in size with greater than 50%  respiratory variability, suggesting right atrial pressure of 3 mmHg.  LARYNGOSCOPY (01/04/2021)  Antimicrobials: Vancomycin Zosyn Ceftriaxone Flagyl    Subjective:  Spanish interpreter: Jasmine December [phone]  Breathing is about stable. Still with dyspnea. Moved to chair with therapy and had some dyspnea on exertion. Still having small amounts of blood with sputum.  Objective: Vitals:   01/09/21 0421 01/09/21 0800 01/09/21 0810 01/09/21 0900  BP: (!) 103/54 (!) 113/51 (!) 113/51   Pulse:  81 77 77  Resp:  (!) 34 20 (!) 32  Temp: 98.7 F (37.1 C)  97.8 F (36.6 C)   TempSrc: Oral  Oral   SpO2:  (!) 86% 91% 90%  Weight:      Height:        Intake/Output Summary (Last 24 hours) at 01/09/2021 1100 Last data filed at 01/09/2021 0900 Gross per 24 hour  Intake 1244.27 ml  Output 3700 ml  Net -2455.73 ml  Filed Weights   01/02/21 1134  Weight: 68 kg    Examination:  General exam: Appears calm and comfortable Respiratory system: Diffuse rales. Respiratory effort normal. Cardiovascular system: S1 & S2 heard, RRR. No murmurs, rubs, gallops or clicks. Gastrointestinal system: Abdomen is  distended, soft and nontender. No organomegaly or masses felt. Normal bowel sounds heard. Central nervous system: Alert and oriented. No focal neurological deficits. Musculoskeletal: No edema. No calf tenderness Skin: No cyanosis. No rashes Psychiatry: Judgement and insight appear normal. Mood & affect appropriate.     Data Reviewed: I have personally reviewed following labs and imaging studies  CBC Lab Results  Component Value Date   WBC 11.2 (H) 01/08/2021   RBC 3.87 (L) 01/08/2021   HGB 11.9 (L) 01/08/2021   HCT 36.3 (L) 01/08/2021   MCV 93.8 01/08/2021   MCH 30.7 01/08/2021   PLT 332 01/08/2021   MCHC 32.8 01/08/2021   RDW 13.1 01/08/2021   LYMPHSABS 2.5 01/07/2021   MONOABS 0.8 01/07/2021   EOSABS 0.3 01/07/2021   BASOSABS 0.1 01/07/2021     Last metabolic panel Lab Results  Component Value Date   NA 134 (L) 01/09/2021   K 3.6 01/09/2021   CL 104 01/09/2021   CO2 24 01/09/2021   BUN 7 01/09/2021   CREATININE 0.66 01/09/2021   GLUCOSE 91 01/09/2021   GFRNONAA >60 01/09/2021   GFRAA >60 07/14/2016   CALCIUM 8.4 (L) 01/09/2021   PROT 7.9 01/03/2021   ALBUMIN 3.0 (L) 01/03/2021   BILITOT 0.9 01/03/2021   ALKPHOS 117 01/03/2021   AST 99 (H) 01/03/2021   ALT 45 (H) 01/03/2021   ANIONGAP 6 01/09/2021    CBG (last 3)  No results for input(s): GLUCAP in the last 72 hours.   GFR: Estimated Creatinine Clearance: 110.8 mL/min (by C-G formula based on SCr of 0.66 mg/dL).  Coagulation Profile: Recent Labs  Lab 01/03/21 0246  INR 1.3*     Recent Results (from the past 240 hour(s))  Resp Panel by RT-PCR (Flu A&B, Covid) Nasopharyngeal Swab     Status: None   Collection Time: 01/02/21  7:48 PM   Specimen: Nasopharyngeal Swab; Nasopharyngeal(NP) swabs in vial transport medium  Result Value Ref Range Status   SARS Coronavirus 2 by RT PCR NEGATIVE NEGATIVE Final    Comment: (NOTE) SARS-CoV-2 target nucleic acids are NOT DETECTED.  The SARS-CoV-2 RNA is  generally detectable in upper respiratory specimens during the acute phase of infection. The lowest concentration of SARS-CoV-2 viral copies this assay can detect is 138 copies/mL. A negative result does not preclude SARS-Cov-2 infection and should not be used as the sole basis for treatment or other patient management decisions. A negative result may occur with  improper specimen collection/handling, submission of specimen other than nasopharyngeal swab, presence of viral mutation(s) within the areas targeted by this assay, and inadequate number of viral copies(<138 copies/mL). A negative result must be combined with clinical observations, patient history, and epidemiological information. The expected result is Negative.  Fact Sheet for Patients:  BloggerCourse.comhttps://www.fda.gov/media/152166/download  Fact Sheet for Healthcare Providers:  SeriousBroker.ithttps://www.fda.gov/media/152162/download  This test is no t yet approved or cleared by the [place] FDA and  has been authorized for detection and/or diagnosis of SARS-CoV-2 by FDA under an Emergency Use Authorization (EUA). This EUA will remain  in effect (meaning this test can be used) for the duration of the COVID-19 declaration under Section 564(b)(1) of the Act, 21 U.S.C.section 360bbb-3(b)(1), unless the authorization is terminated  or revoked sooner.       Influenza A by PCR NEGATIVE NEGATIVE Final   Influenza B by PCR NEGATIVE NEGATIVE Final    Comment: (NOTE) The Xpert Xpress SARS-CoV-2/FLU/RSV plus assay is intended as an aid in the diagnosis of influenza from Nasopharyngeal swab specimens and should not be used as a sole basis for treatment. Nasal washings and aspirates are unacceptable for Xpert Xpress SARS-CoV-2/FLU/RSV testing.  Fact Sheet for Patients: BloggerCourse.com  Fact Sheet for Healthcare Providers: SeriousBroker.it  This test is not yet approved or cleared by the [place] FDA and has been authorized for detection and/or diagnosis of SARS-CoV-2 by FDA under an Emergency Use Authorization (EUA). This EUA will remain in effect (meaning this test can be used) for the duration of the COVID-19 declaration under Section 564(b)(1) of the Act, 21 U.S.C. section 360bbb-3(b)(1), unless the authorization is terminated or revoked.  Performed at [facility], [address], [place], [place] [address]   Surgical pcr screen     Status: Abnormal   Collection Time: 01/03/21  5:38 AM   Specimen: Nasal Mucosa; Nasal Swab  Result Value Ref Range Status   MRSA, PCR NEGATIVE NEGATIVE Final   Staphylococcus aureus POSITIVE (A) NEGATIVE Final    Comment: (NOTE) The Xpert SA Assay (FDA approved for NASAL specimens in patients 24 years of age and older), is one component of a comprehensive surveillance program. It is not intended to diagnose infection nor to guide or monitor treatment. Performed at [facility], [address], [place], [place] [address]   Aerobic/Anaerobic Culture w Gram Stain (surgical/deep wound)     Status: None   Collection Time: 01/03/21  4:26 PM   Specimen: Abscess  Result Value Ref Range Status   Specimen Description ABSCESS  Final   Special Requests PERIRECTAL ABSCESS SPEC A  Final   Gram Stain   Final    RARE WBC PRESENT,BOTH PMN AND MONONUCLEAR FEW GRAM NEGATIVE RODS    Culture   Final    FEW ESCHERICHIA COLI FEW BACTEROIDES OVATUS FEW BACTEROIDES THETAIOTAOMICRON BETA LACTAMASE POSITIVE Performed at [facility], [address] [place], [place] [address]    Report Status 01/06/2021 FINAL  Final   Organism ID, Bacteria ESCHERICHIA COLI  Final      Susceptibility   Escherichia coli - MIC*    AMPICILLIN <=2 SENSITIVE Sensitive     CEFAZOLIN <=4 SENSITIVE Sensitive     CEFEPIME <=0.12 SENSITIVE Sensitive     CEFTAZIDIME <=1 SENSITIVE Sensitive     CEFTRIAXONE <=0.25 SENSITIVE Sensitive     CIPROFLOXACIN  <=0.25 SENSITIVE Sensitive     GENTAMICIN <=1 SENSITIVE Sensitive     IMIPENEM <=0.25 SENSITIVE Sensitive     TRIMETH/SULFA <=20 SENSITIVE Sensitive     AMPICILLIN/SULBACTAM <=2 SENSITIVE Sensitive     PIP/TAZO <=4 SENSITIVE Sensitive     * FEW ESCHERICHIA COLI         Radiology Studies: DG CHEST PORT 1 VIEW  Result Date: 01/08/2021 CLINICAL DATA:  Hypoxia. EXAM: PORTABLE CHEST 1 VIEW COMPARISON:  January 06, 2021. FINDINGS: The heart size and mediastinal contours are within normal limits. No pneumothorax or pleural effusion is noted. Patchy diffuse airspace opacities are noted most consistent with multifocal pneumonia. The visualized skeletal structures are unremarkable. IMPRESSION: Stable bilateral patchy airspace opacities concerning for multifocal pneumonia. Electronically Signed   By: Lupita Raider M.D.   On: 01/08/2021 13:19  Scheduled Meds:   Continuous Infusions:     LOS: 6 days     Jacquelin Hawking, MD [facility] 01/09/2021, 11:00 AM  If 7PM-7AM, please contact night-coverage [email or web address]

## 2021-01-09 NOTE — Evaluation (Signed)
Physical Therapy Evaluation Patient Details Name: Trevor Dixon MRN: [id number] DOB: [birth year] Today's Date: 01/09/2021   History of Present Illness  40 y.o. male presenting to ED 7/14 with constipation and R buttock pain. Patient admitted with perirectal abscess and sepsis s/p I&D 7/15. Hospital admission complicated by hemoptysis of unknown etiology with associated respiratory failure improving with IV lasix. Suspicion for possible pulmonary edema. No significant PMHx noted.  Clinical Impression   Pt admitted secondary to problem above with deficits below. PTA patient was independent with mobility and ADLs.  Pt currently requires min assist due to reported dizziness and mild unsteadiness (required UE support during ambulation). Patient reports spinning dizziness with supine to sit. No nystagmus noted. Does not resolve with return to supine. BP stable with return to sitting EOB and able to progress to walking, however reported dizziness/spinning persisted. ?medication related?? Duration not consistent with peripheral vestibulopathy and no nystagmus noted with smooth pursuits.  Anticipate patient will benefit from PT to address problems listed below.Will continue to follow acutely to maximize functional mobility independence and safety.       Follow Up Recommendations No PT follow up    Equipment Recommendations  None recommended by PT    Recommendations for Other Services       Precautions / Restrictions Precautions Precautions: Fall Precaution Comments: Monitor SpO2 Restrictions Weight Bearing Restrictions: No      Mobility  Bed Mobility Overal bed mobility: Needs Assistance Bed Mobility: Supine to Sit;Sit to Supine     Supine to sit: Min guard Sit to supine: Min guard   General bed mobility comments: Able to come to EOB with Min guard for safety/line management. Patient reporting dizziness upon sitting spontaneously returning to supine with Min guard.     Transfers Overall transfer level: Needs assistance Equipment used: 1 person hand held assist Transfers: Sit to/from [employer] Sit to Stand: Min guard Stand pivot transfers: Min guard       General transfer comment: Min guard for sit to stand from EOB with increased time secondary to dizziness. Min guard for stand-pivot to recliner with unilateral UE support on IV pole.  Ambulation/Gait Ambulation/Gait assistance: Min Chemical engineer (Feet): 40 Feet Assistive device: IV Pole Gait Pattern/deviations: Step-through pattern;Decreased stride length     General Gait Details: heavy support via IV pole/RUE; pt reporting spinning dizziness with no nystagmus noted  Stairs            Wheelchair Mobility    Modified Rankin (Stroke Patients Only)       Balance Overall balance assessment: Needs assistance Sitting-balance support: Bilateral upper extremity supported;Feet supported Sitting balance-Leahy Scale: Fair Sitting balance - Comments: Reports dizziness upon sitting at EOB.   Standing balance support: Single extremity supported;During functional activity Standing balance-Leahy Scale: Poor Standing balance comment: Reliant on at least unilateral UE support and external assist with dynamic balance.                             Pertinent Vitals/Pain Pain Assessment: 0-10 Pain Score: 7  Pain Location: Buttocks, low back, neck and chest (with coughing) Pain Descriptors / Indicators: Aching;Sore Pain Intervention(s): Limited activity within patient's tolerance;Repositioned    Home Living Family/patient expects to be discharged to:: Private residence Living Arrangements: Other (Comment) (Brother) Available Help at Discharge: Family;Available 24 hours/day Type of Home: Apartment Home Access: Stairs to enter (1st floor) Entrance Stairs-Rails: None Entrance Stairs-Number of Steps: 2 Home Layout: One  level Home Equipment: None       Prior Function Level of Independence: Independent         Comments: I with ADLs/IADLs; working several days/wk     Hand Dominance        Extremity/Trunk Assessment   Upper Extremity Assessment Upper Extremity Assessment: Generalized weakness    Lower Extremity Assessment Lower Extremity Assessment: Overall WFL for tasks assessed    Cervical / Trunk Assessment Cervical / Trunk Assessment: Normal  Communication   Communication: Prefers language other than [place];Other (comment) (Virtual interpreter used for evaluation)  Cognition Arousal/Alertness: Awake/alert Behavior During Therapy: WFL for tasks assessed/performed Overall Cognitive Status: Difficult to assess                                 General Comments: A&Ox4; follows verbal commands with good accuracy. Misunderstood some questions via interpreter with need for re-phrasing to get an appropriate answer.      General Comments General comments (skin integrity, edema, etc.): SpO2 88% on 6L upon entry. Titrated up to 8L with mobility in room. Unable to tolerate mask. SpO2 dropped to 84% with mask off and mobility ~47ft. Rebounds within 30 seconds. At conclusion of session, patient with SpO2 88% on 6L.    Exercises Other Exercises Other Exercises: educated on use of IS; pt able to pull x 1 rep and then coughs with subsequent reps <1041ml   Assessment/Plan    PT Assessment Patient needs continued PT services  PT Problem List Decreased activity tolerance;Decreased balance;Decreased mobility;Decreased knowledge of use of DME;Cardiopulmonary status limiting activity       PT Treatment Interventions DME instruction;Gait training;Functional mobility training;Therapeutic activities;Patient/family education    PT Goals (Current goals can be found in the Care Plan section)  Acute Rehab PT Goals Patient Stated Goal: To decrease pain. PT Goal Formulation: With patient Time For Goal Achievement:  01/23/21 Potential to Achieve Goals: Good    Frequency Min 3X/week   Barriers to discharge        Co-evaluation   Reason for Co-Treatment: Complexity of the patient's impairments (multi-system involvement)   OT goals addressed during session: ADL's and self-care       AM-PAC PT "6 Clicks" Mobility  Outcome Measure Help needed turning from your back to your side while in a flat bed without using bedrails?: A Little Help needed moving from lying on your back to sitting on the side of a flat bed without using bedrails?: A Little Help needed moving to and from a bed to a chair (including a wheelchair)?: A Little Help needed standing up from a chair using your arms (e.g., wheelchair or bedside chair)?: A Little Help needed to walk in hospital room?: A Little Help needed climbing 3-5 steps with a railing? : A Little 6 Click Score: 18    End of Session Equipment Utilized During Treatment: Gait belt;Oxygen Activity Tolerance: Treatment limited secondary to medical complications (Comment) (desaturating) Patient left: in chair;with call bell/phone within reach;Other (comment) (with MD) Nurse Communication: Mobility status;Other (comment) (desaturating on 6L and incr to 8L for activity; returned to 6L at rest with sats 84-88%) PT Visit Diagnosis: Difficulty in walking, not elsewhere classified (R26.2)    Time: 7253-6644 PT Time Calculation (min) (ACUTE ONLY): 35 min   Charges:   PT Evaluation $PT Eval Low Complexity: 1 Low           Jerolyn Center, PT Pager [phone])  [phone]   Scherrie November Maritza G[place] 01/09/2021, 12:29 PM

## 2021-01-10 ENCOUNTER — Inpatient Hospital Stay (HOSPITAL_COMMUNITY): Payer: Self-pay

## 2021-01-10 LAB — CBC
HCT: 36.1 % — ABNORMAL LOW (ref 39.0–52.0)
Hemoglobin: 12.2 g/dL — ABNORMAL LOW (ref 13.0–17.0)
MCH: 31.7 pg (ref 26.0–34.0)
MCHC: 33.8 g/dL (ref 30.0–36.0)
MCV: 93.8 fL (ref 80.0–100.0)
Platelets: 330 10*3/uL (ref 150–400)
RBC: 3.85 MIL/uL — ABNORMAL LOW (ref 4.22–5.81)
RDW: 13.2 % (ref 11.5–15.5)
WBC: 12.4 10*3/uL — ABNORMAL HIGH (ref 4.0–10.5)
nRBC: 0 % (ref 0.0–0.2)

## 2021-01-10 LAB — BASIC METABOLIC PANEL
Anion gap: 6 (ref 5–15)
BUN: 10 mg/dL (ref 6–20)
CO2: 25 mmol/L (ref 22–32)
Calcium: 8.4 mg/dL — ABNORMAL LOW (ref 8.9–10.3)
Chloride: 102 mmol/L (ref 98–111)
Creatinine, Ser: 0.73 mg/dL (ref 0.61–1.24)
GFR, Estimated: >60 mL/min (ref >60)
Glucose, Bld: 104 mg/dL — ABNORMAL HIGH (ref 70–99)
Potassium: 3.5 mmol/L (ref 3.5–5.1)
Sodium: 133 mmol/L — ABNORMAL LOW (ref 135–145)

## 2021-01-10 LAB — PROCALCITONIN: Procalcitonin: 0.5 ng/mL

## 2021-01-10 MED ORDER — AZITHROMYCIN 250 MG PO TABS
500.0000 mg | ORAL_TABLET | Freq: Every day | ORAL | Status: AC
Start: 2021-01-10 — End: 2021-01-14
  Administered 2021-01-10 – 2021-01-14 (×5): 500 mg via ORAL
  Filled 2021-01-10 (×5): qty 2

## 2021-01-10 MED ORDER — SODIUM CHLORIDE 0.9 % IV SOLN
2.0000 g | INTRAVENOUS | Status: DC
  Administered 2021-01-10 – 2021-01-13 (×3): 2 g via INTRAVENOUS
  Filled 2021-01-10 (×2): qty 20
  Filled 2021-01-10: qty 2
  Filled 2021-01-10: qty 20
  Filled 2021-01-10: qty 2

## 2021-01-10 NOTE — Progress Notes (Signed)
Oxygen demand increased, oxygen at 8L, oxygen saturation 90-91%.  Patient instructed to cough, deep breathe, incentive spirometry use. Treated for cough and pain. Respiratory therapy paged to assess patient. Oxygen increased to 10L HFNC, oxygen saturation 94-95%.  Patient agreed to be placed on bipap, 60% bipap.  Dr Loney Loh notified of increased oxygen demand and bipap.

## 2021-01-10 NOTE — Progress Notes (Addendum)
Physical Therapy Treatment Patient Details Name: Trevor Dixon MRN: [id number] DOB: 09/22/1980 Today's Date: 01/10/2021    History of Present Illness Pt is a 40 y.o. male presenting to ED 7/14 with constipation and R buttock pain. Patient admitted with perirectal abscess and sepsis s/p I&D 7/15. Hospital admission complicated by hemoptysis of unknown etiology with associated respiratory failure improving with IV lasix. Suspicion for possible pulmonary edema. No significant PMHx noted.    PT Comments    Pt making steady progress overall with his functional mobility. He remains limited secondary to high O2 requirements. He was on BiPAP earlier this AM but transitioned to 10L O2 HFNC with SpO2 maintaining at 92-93% throughout session. Pt's RN was in the room at the beginning of the session and aware of his reports of SOB. PT will continue to follow pt acutely to progress mobility as tolerated and to ensure a safe d/c home when medically appropriate.   Spanish interpreter utilized throughout Paula Compton [phone]).   Follow Up Recommendations  No PT follow up     Equipment Recommendations  None recommended by PT    Recommendations for Other Services       Precautions / Restrictions Precautions Precautions: Fall Precaution Comments: Monitor SpO2 Restrictions Weight Bearing Restrictions: No    Mobility  Bed Mobility Overal bed mobility: Independent                  Transfers Overall transfer level: Needs assistance Equipment used: None Transfers: Sit to/from Stand;Stand Pivot Transfers Sit to Stand: Supervision Stand pivot transfers: Supervision       General transfer comment: supervision for safety and line management; no instability or difficulties performing  Ambulation/Gait             General Gait Details: deferred as pt reported having a hard time breathing to PT and RN. Pt on 10L of HFNC with SpO2 maintaining at 92-93% throughout. RN aware and stated she  would continue to monitor   Stairs             Wheelchair Mobility    Modified Rankin (Stroke Patients Only)       Balance Overall balance assessment: Needs assistance Sitting-balance support: Feet supported Sitting balance-Leahy Scale: Good     Standing balance support: During functional activity Standing balance-Leahy Scale: Fair                              Cognition Arousal/Alertness: Awake/alert Behavior During Therapy: WFL for tasks assessed/performed Overall Cognitive Status: Within Functional Limits for tasks assessed                                 General Comments: cognition not formally assessed (difficult to do with interpreter), but [place] [place] for general conversation      Exercises      General Comments        Pertinent Vitals/Pain Pain Assessment: Faces Faces Pain Scale: Hurts little more Pain Location: headache Pain Descriptors / Indicators: Headache Pain Intervention(s): Monitored during session    Home Living                      Prior Function            PT Goals (current goals can now be found in the care plan section) Acute Rehab PT Goals PT Goal Formulation: With patient Time For  Goal Achievement: 01/23/21 Potential to Achieve Goals: Good Progress towards PT goals: Progressing toward goals    Frequency    Min 3X/week      PT Plan Current plan remains appropriate    Co-evaluation              AM-PAC PT "6 Clicks" Mobility   Outcome Measure  Help needed turning from your back to your side while in a flat bed without using bedrails?: None Help needed moving from lying on your back to sitting on the side of a flat bed without using bedrails?: A Little Help needed moving to and from a bed to a chair (including a wheelchair)?: A Little Help needed standing up from a chair using your arms (e.g., wheelchair or bedside chair)?: A Little Help needed to walk in hospital room?: A  Little Help needed climbing 3-5 steps with a railing? : A Little 6 Click Score: 19    End of Session Equipment Utilized During Treatment: Oxygen Activity Tolerance: Patient tolerated treatment well;Other (comment) (limited secondary to high O2 needs) Patient left: in chair;with call bell/phone within reach;with chair alarm set Nurse Communication: Mobility status PT Visit Diagnosis: Difficulty in walking, not elsewhere classified (R26.2)     Time: 3810-1751 PT Time Calculation (min) (ACUTE ONLY): 28 min  Charges:  $Therapeutic Activity: 23-37 mins                     Arletta Bale, DPT  Acute Rehabilitation Services Office [phone]    Alessandra Bevels Tamsin Nader 01/10/2021, 10:59 AM

## 2021-01-10 NOTE — Plan of Care (Signed)
  Problem: Education: Goal: Knowledge of General Education information will improve Description Including pain rating scale, medication(s)/side effects and non-pharmacologic comfort measures Outcome: Progressing   Problem: Health Behavior/Discharge Planning: Goal: Ability to manage health-related needs will improve Outcome: Progressing   

## 2021-01-10 NOTE — Progress Notes (Signed)
PROGRESS NOTE    Trevor Dixon  ZLD:[id number] DOB: 1981-04-23 DOA: 01/02/2021 PCP: System, Provider Not In   Brief Narrative: Trevor Dixon is a 40 y.o. male with no past medical history.  Patient presented secondary to constipation and right buttock pain was found to have a perirectal abscess.  General surgery was consulted and performed an I&D on 7/15.  Patient developed hemoptysis with associated respiratory failure but is now improving with IV Lasix.  Unknown certain etiology of his hemoptysis.   Assessment & Plan:   Principal Problem:   Sepsis (HCC) Active Problems:   Rectal pain   LFT elevation   Alcohol use   Acute respiratory failure with hypoxia (HCC)   Hemoptysis   Sepsis Present on admission. Secondary to perirectal abscess. Management below.  Perirectal abscess Patient started empirically on Zosyn, transitioned to Ceftriaxone and Flagyl. General surgery consulted and performed I&D on 7/15; culture significant for E. Coli which was pan-sensitive. Patient continued on Ceftriaxone and flagyl and has completed course.  Acute respiratory failure with hypoxia Secondary to hemoptysis. Patient has needed as much as 15 lpm via HFNC. CT chest scan significant for diffuse airspace disease concerning for possible pulmonary edema. Patient had initial improvement with Lasix. Transthoracic Echocardiogram without LV dysfunction. PT and OT recommending no follow-up. Worsening hypoxia overnight with increase to 8 lpm of oxygen -Continue Lasix IV daily -Watch UOP -Wean oxygen to room air -2 view chest x-ray  Hemoptysis Unknown etiology. Pulmonology consulted. Initially thought secondary to epistaxis, however ENT was consulted and bedside scope was negative for bleeding source. Symptoms improved but still with streaking blood in sputum today. Pulmonology considered bronchoscopy but deferred at this time. Hemoglobin is stable.   DVT prophylaxis: SCDs Code Status:   Code  Status: Full Code Family Communication: None at bedside Disposition Plan: Discharge home when able to wean oxygen. Likely 2-5 days   Consultants:  PCCM Otolaryngology  Procedures:  TRANSTHORACIC ECHOCARDIOGRAM (01/06/2021) IMPRESSIONS     1. Left ventricular ejection fraction, by estimation, is 55 to 60%. The  left ventricle has normal function. The left ventricle has no regional  wall motion abnormalities. Left ventricular diastolic parameters were  normal.   2. Right ventricular systolic function is normal. The right ventricular  size is normal.   3. The mitral valve is normal in structure. No evidence of mitral valve  regurgitation. No evidence of mitral stenosis.   4. The aortic valve is normal in structure. Aortic valve regurgitation is  not visualized. No aortic stenosis is present.   5. The inferior vena cava is normal in size with greater than 50%  respiratory variability, suggesting right atrial pressure of 3 mmHg.  LARYNGOSCOPY (01/04/2021)  Antimicrobials: Vancomycin Zosyn Ceftriaxone Flagyl    Subjective:  Spanish interpreter: Leonardo [phone]  Some small amounts of bloody sputum. Feels like he is not better. No significant dyspnea.  Objective: Vitals:   01/10/21 0533 01/10/21 0719 01/10/21 0759 01/10/21 0819  BP: 102/61   106/64  Pulse:  74  65  Resp: 20 18  19   Temp: 98.5 F (36.9 C)   98.6 F (37 C)  TempSrc: Oral   Oral  SpO2: 97% 95% 90% 92%  Weight:      Height:        Intake/Output Summary (Last 24 hours) at 01/10/2021 1055 Last data filed at 01/10/2021 0500 Gross per 24 hour  Intake 360 ml  Output 1900 ml  Net -1540 ml    01/12/2021  01/02/21 1134  Weight: 68 kg    Examination:  General exam: Appears calm and comfortable and in no acute distress. Conversant Respiratory: Clear to auscultation. Respiratory effort normal with no intercostal retractions or use of accessory muscles Cardiovascular: S1 & S2 heard, RRR. No murmurs,  rubs, gallops or clicks. No edema Gastrointestinal: Abdomen is nondistended, soft and nontender. No masses felt. Normal bowel sounds heard Neurologic: No focal neurological deficits Musculoskeletal: No calf tenderness Skin: No cyanosis. No new rashes Psychiatry: Alert and oriented. Memory intact. Mood & affect appropriate   Data Reviewed: I have personally reviewed following labs and imaging studies  CBC Lab Results  Component Value Date   WBC 12.4 (H) 01/10/2021   RBC 3.85 (L) 01/10/2021   HGB 12.2 (L) 01/10/2021   HCT 36.1 (L) 01/10/2021   MCV 93.8 01/10/2021   MCH 31.7 01/10/2021   PLT 330 01/10/2021   MCHC 33.8 01/10/2021   RDW 13.2 01/10/2021   LYMPHSABS 2.5 01/07/2021   MONOABS 0.8 01/07/2021   EOSABS 0.3 01/07/2021   BASOSABS 0.1 01/07/2021     Last metabolic panel Lab Results  Component Value Date   NA 133 (L) 01/10/2021   K 3.5 01/10/2021   CL 102 01/10/2021   CO2 25 01/10/2021   BUN 10 01/10/2021   CREATININE 0.73 01/10/2021   GLUCOSE 104 (H) 01/10/2021   GFRNONAA >60 01/10/2021   GFRAA >60 07/14/2016   CALCIUM 8.4 (L) 01/10/2021   PROT 7.9 01/03/2021   ALBUMIN 3.0 (L) 01/03/2021   BILITOT 0.9 01/03/2021   ALKPHOS 117 01/03/2021   AST 99 (H) 01/03/2021   ALT 45 (H) 01/03/2021   ANIONGAP 6 01/10/2021    CBG (last 3)  No results for input(s): GLUCAP in the last 72 hours.   GFR: Estimated Creatinine Clearance: 110.8 mL/min (by C-G formula based on SCr of 0.73 mg/dL).  Coagulation Profile: No results for input(s): INR, PROTIME in the last 168 hours.   Recent Results (from the past 240 hour(s))  Resp Panel by RT-PCR (Flu A&B, Covid) Nasopharyngeal Swab     Status: None   Collection Time: 01/02/21  7:48 PM   Specimen: Nasopharyngeal Swab; Nasopharyngeal(NP) swabs in vial transport medium  Result Value Ref Range Status   SARS Coronavirus 2 by RT PCR NEGATIVE NEGATIVE Final    Comment: (NOTE) SARS-CoV-2 target nucleic acids are NOT  DETECTED.  The SARS-CoV-2 RNA is generally detectable in upper respiratory specimens during the acute phase of infection. The lowest concentration of SARS-CoV-2 viral copies this assay can detect is 138 copies/mL. A negative result does not preclude SARS-Cov-2 infection and should not be used as the sole basis for treatment or other patient management decisions. A negative result may occur with  improper specimen collection/handling, submission of specimen other than nasopharyngeal swab, presence of viral mutation(s) within the areas targeted by this assay, and inadequate number of viral copies(<138 copies/mL). A negative result must be combined with clinical observations, patient history, and epidemiological information. The expected result is Negative.  Fact Sheet for Patients:  BloggerCourse.com  Fact Sheet for Healthcare Providers:  SeriousBroker.it  This test is no t yet approved or cleared by the [place] FDA and  has been authorized for detection and/or diagnosis of SARS-CoV-2 by FDA under an Emergency Use Authorization (EUA). This EUA will remain  in effect (meaning this test can be used) for the duration of the COVID-19 declaration under Section 564(b)(1) of the Act, 21 U.S.C.section 360bbb-3(b)(1), unless the authorization is  terminated  or revoked sooner.       Influenza A by PCR NEGATIVE NEGATIVE Final   Influenza B by PCR NEGATIVE NEGATIVE Final    Comment: (NOTE) The Xpert Xpress SARS-CoV-2/FLU/RSV plus assay is intended as an aid in the diagnosis of influenza from Nasopharyngeal swab specimens and should not be used as a sole basis for treatment. Nasal washings and aspirates are unacceptable for Xpert Xpress SARS-CoV-2/FLU/RSV testing.  Fact Sheet for Patients: BloggerCourse.comhttps://www.fda.gov/media/152166/download  Fact Sheet for Healthcare Providers: SeriousBroker.ithttps://www.fda.gov/media/152162/download  This test is not yet  approved or cleared by the [place] FDA and has been authorized for detection and/or diagnosis of SARS-CoV-2 by FDA under an Emergency Use Authorization (EUA). This EUA will remain in effect (meaning this test can be used) for the duration of the COVID-19 declaration under Section 564(b)(1) of the Act, 21 U.S.C. section 360bbb-3(b)(1), unless the authorization is terminated or revoked.  Performed at [facility], [address] [place], [place] [address]   Surgical pcr screen     Status: Abnormal   Collection Time: 01/03/21  5:38 AM   Specimen: Nasal Mucosa; Nasal Swab  Result Value Ref Range Status   MRSA, PCR NEGATIVE NEGATIVE Final   Staphylococcus aureus POSITIVE (A) NEGATIVE Final    Comment: (NOTE) The Xpert SA Assay (FDA approved for NASAL specimens in patients 40 years of age and older), is one component of a comprehensive surveillance program. It is not intended to diagnose infection nor to guide or monitor treatment. Performed at [facility], [address] [place], [place] [address]   Aerobic/Anaerobic Culture w Gram Stain (surgical/deep wound)     Status: None   Collection Time: 01/03/21  4:26 PM   Specimen: Abscess  Result Value Ref Range Status   Specimen Description ABSCESS  Final   Special Requests PERIRECTAL ABSCESS SPEC A  Final   Gram Stain   Final    RARE WBC PRESENT,BOTH PMN AND MONONUCLEAR FEW GRAM NEGATIVE RODS    Culture   Final    FEW ESCHERICHIA COLI FEW BACTEROIDES OVATUS FEW BACTEROIDES THETAIOTAOMICRON BETA LACTAMASE POSITIVE Performed at [facility], [address] [place], [place] [address]    Report Status 01/06/2021 FINAL  Final   Organism ID, Bacteria ESCHERICHIA COLI  Final      Susceptibility   Escherichia coli - MIC*    AMPICILLIN <=2 SENSITIVE Sensitive     CEFAZOLIN <=4 SENSITIVE Sensitive     CEFEPIME <=0.12 SENSITIVE Sensitive     CEFTAZIDIME <=1 SENSITIVE Sensitive     CEFTRIAXONE <=0.25 SENSITIVE  Sensitive     CIPROFLOXACIN <=0.25 SENSITIVE Sensitive     GENTAMICIN <=1 SENSITIVE Sensitive     IMIPENEM <=0.25 SENSITIVE Sensitive     TRIMETH/SULFA <=20 SENSITIVE Sensitive     AMPICILLIN/SULBACTAM <=2 SENSITIVE Sensitive     PIP/TAZO <=4 SENSITIVE Sensitive     * FEW ESCHERICHIA COLI         Radiology Studies: DG CHEST PORT 1 VIEW  Result Date: 01/08/2021 CLINICAL DATA:  Hypoxia. EXAM: PORTABLE CHEST 1 VIEW COMPARISON:  January 06, 2021. FINDINGS: The heart size and mediastinal contours are within normal limits. No pneumothorax or pleural effusion is noted. Patchy diffuse airspace opacities are noted most consistent with multifocal pneumonia. The visualized skeletal structures are unremarkable. IMPRESSION: Stable bilateral patchy airspace opacities concerning for multifocal pneumonia. Electronically Signed   By: Lupita RaiderJames  Green Jr M.D.   On: 01/08/2021 13:19  Scheduled Meds:  furosemide  40 mg Intravenous Daily    Continuous Infusions:     LOS: 7 days     Jacquelin Hawking, MD [facility] 01/10/2021, 10:55 AM  If 7PM-7AM, please contact night-coverage [email or web address]

## 2021-01-11 LAB — BASIC METABOLIC PANEL
Anion gap: 7 (ref 5–15)
BUN: 9 mg/dL (ref 6–20)
CO2: 23 mmol/L (ref 22–32)
Calcium: 8.3 mg/dL — ABNORMAL LOW (ref 8.9–10.3)
Chloride: 103 mmol/L (ref 98–111)
Creatinine, Ser: 0.63 mg/dL (ref 0.61–1.24)
GFR, Estimated: >60 mL/min (ref >60)
Glucose, Bld: 90 mg/dL (ref 70–99)
Potassium: 3.3 mmol/L — ABNORMAL LOW (ref 3.5–5.1)
Sodium: 133 mmol/L — ABNORMAL LOW (ref 135–145)

## 2021-01-11 LAB — PROCALCITONIN: Procalcitonin: 0.48 ng/mL

## 2021-01-11 MED ORDER — POTASSIUM CHLORIDE CRYS ER 20 MEQ PO TBCR
40.0000 meq | EXTENDED_RELEASE_TABLET | ORAL | Status: AC
  Administered 2021-01-11 (×2): 40 meq via ORAL
  Filled 2021-01-11 (×2): qty 2

## 2021-01-11 NOTE — Progress Notes (Signed)
PROGRESS NOTE    Trevor Dixon  YTK:[id number] DOB: [birth year] DOA: 01/02/2021 PCP: System, Provider Not In   Brief Narrative: Trevor Dixon is a 40 y.o. male with no past medical history.  Patient presented secondary to constipation and right buttock pain was found to have a perirectal abscess.  General surgery was consulted and performed an I&D on 7/15.  Patient developed hemoptysis with associated respiratory failure but is now improving with IV Lasix.  Unknown certain etiology of his hemoptysis.   Assessment & Plan:   Principal Problem:   Sepsis (HCC) Active Problems:   Rectal pain   LFT elevation   Alcohol use   Acute respiratory failure with hypoxia (HCC)   Hemoptysis   Sepsis Present on admission. Secondary to perirectal abscess. Management below.  Perirectal abscess Patient started empirically on Zosyn, transitioned to Ceftriaxone and Flagyl. General surgery consulted and performed I&D on 7/15; culture significant for E. Coli which was pan-sensitive. Patient continued on Ceftriaxone and flagyl and has completed course.  Acute respiratory failure with hypoxia Secondary to hemoptysis. Patient has needed as much as 15 lpm via HFNC. CT chest scan significant for diffuse airspace disease concerning for possible pulmonary edema. Patient had initial improvement with Lasix. Transthoracic Echocardiogram without LV dysfunction. PT and OT recommending no follow-up. Worsening hypoxia overnight with increase to 8 lpm of oxygen -Continue Lasix IV daily -Watch UOP while on Lasix -Wean oxygen to room air as able  Multifocal infiltrates Possible pneumonia. Procalcitonin is moderately elevated. WBC is slightly elevated still, although improved from before. -Continue Ceftriaxone and azithromcycin  Hemoptysis Unknown etiology. Pulmonology consulted. Initially thought secondary to epistaxis, however ENT was consulted and bedside scope was negative for bleeding source. Symptoms  improved but still with streaking blood in sputum today. Pulmonology considered bronchoscopy but deferred at this time. Hemoglobin is stable.   DVT prophylaxis: SCDs Code Status:   Code Status: Full Code Family Communication: None at bedside Disposition Plan: Discharge home when able to wean oxygen. Likely 2-5 days   Consultants:  PCCM Otolaryngology  Procedures:  TRANSTHORACIC ECHOCARDIOGRAM (01/06/2021) IMPRESSIONS     1. Left ventricular ejection fraction, by estimation, is 55 to 60%. The  left ventricle has normal function. The left ventricle has no regional  wall motion abnormalities. Left ventricular diastolic parameters were  normal.   2. Right ventricular systolic function is normal. The right ventricular  size is normal.   3. The mitral valve is normal in structure. No evidence of mitral valve  regurgitation. No evidence of mitral stenosis.   4. The aortic valve is normal in structure. Aortic valve regurgitation is  not visualized. No aortic stenosis is present.   5. The inferior vena cava is normal in size with greater than 50%  respiratory variability, suggesting right atrial pressure of 3 mmHg.  LARYNGOSCOPY (01/04/2021)  Antimicrobials: Vancomycin Zosyn Ceftriaxone Flagyl  Ceftriaxone Azithromycin   Subjective:  Spanish interpreter: Darien Ramus [phone]  Has had to poop for the past two hours. Breathing has improved. Hemoptysis has also improved. No chest pain since his coughing has improved.  Objective: Vitals:   01/11/21 0005 01/11/21 0402 01/11/21 0736 01/11/21 0747  BP:   (!) 114/57   Pulse:   69 73  Resp:   (!) 23 20  Temp: 98.3 F (36.8 C) 97.7 F (36.5 C) 98 F (36.7 C)   TempSrc: Oral Axillary Oral   SpO2:   95% 98%  Weight:      Height:  Intake/Output Summary (Last 24 hours) at 01/11/2021 1017 Last data filed at 01/11/2021 0300 Gross per 24 hour  Intake 100 ml  Output 800 ml  Net -700 ml    Filed Weights   01/02/21 1134  Weight:  68 kg    Examination:  General exam: Appears calm and comfortable and in no acute distress. Conversant Respiratory: Clear to auscultation except for rales at bilateral bases. Respiratory effort normal with no intercostal retractions or use of accessory muscles Cardiovascular: S1 & S2 heard, RRR. No murmurs, rubs, gallops or clicks. No LE edema Gastrointestinal: Abdomen is non-distended, soft and non-tender. No masses felt. Normal bowel sounds heard Neurologic: No focal neurological deficits Musculoskeletal: No calf tenderness Skin: No cyanosis. No new rashes Psychiatry: Alert and oriented. Memory intact. Mood & affect appropriate   Data Reviewed: I have personally reviewed following labs and imaging studies  CBC Lab Results  Component Value Date   WBC 12.4 (H) 01/10/2021   RBC 3.85 (L) 01/10/2021   HGB 12.2 (L) 01/10/2021   HCT 36.1 (L) 01/10/2021   MCV 93.8 01/10/2021   MCH 31.7 01/10/2021   PLT 330 01/10/2021   MCHC 33.8 01/10/2021   RDW 13.2 01/10/2021   LYMPHSABS 2.5 01/07/2021   MONOABS 0.8 01/07/2021   EOSABS 0.3 01/07/2021   BASOSABS 0.1 01/07/2021     Last metabolic panel Lab Results  Component Value Date   NA 133 (L) 01/11/2021   K 3.3 (L) 01/11/2021   CL 103 01/11/2021   CO2 23 01/11/2021   BUN 9 01/11/2021   CREATININE 0.63 01/11/2021   GLUCOSE 90 01/11/2021   GFRNONAA >60 01/11/2021   GFRAA >60 07/14/2016   CALCIUM 8.3 (L) 01/11/2021   PROT 7.9 01/03/2021   ALBUMIN 3.0 (L) 01/03/2021   BILITOT 0.9 01/03/2021   ALKPHOS 117 01/03/2021   AST 99 (H) 01/03/2021   ALT 45 (H) 01/03/2021   ANIONGAP 7 01/11/2021    CBG (last 3)  No results for input(s): GLUCAP in the last 72 hours.   GFR: Estimated Creatinine Clearance: 110.8 mL/min (by C-G formula based on SCr of 0.63 mg/dL).  Coagulation Profile: No results for input(s): INR, PROTIME in the last 168 hours.   Recent Results (from the past 240 hour(s))  Resp Panel by RT-PCR (Flu A&B, Covid)  Nasopharyngeal Swab     Status: None   Collection Time: 01/02/21  7:48 PM   Specimen: Nasopharyngeal Swab; Nasopharyngeal(NP) swabs in vial transport medium  Result Value Ref Range Status   SARS Coronavirus 2 by RT PCR NEGATIVE NEGATIVE Final    Comment: (NOTE) SARS-CoV-2 target nucleic acids are NOT DETECTED.  The SARS-CoV-2 RNA is generally detectable in upper respiratory specimens during the acute phase of infection. The lowest concentration of SARS-CoV-2 viral copies this assay can detect is 138 copies/mL. A negative result does not preclude SARS-Cov-2 infection and should not be used as the sole basis for treatment or other patient management decisions. A negative result may occur with  improper specimen collection/handling, submission of specimen other than nasopharyngeal swab, presence of viral mutation(s) within the areas targeted by this assay, and inadequate number of viral copies(<138 copies/mL). A negative result must be combined with clinical observations, patient history, and epidemiological information. The expected result is Negative.  Fact Sheet for Patients:  BloggerCourse.com  Fact Sheet for Healthcare Providers:  SeriousBroker.it  This test is no t yet approved or cleared by the [place] FDA and  has been authorized for detection and/or  diagnosis of SARS-CoV-2 by FDA under an Emergency Use Authorization (EUA). This EUA will remain  in effect (meaning this test can be used) for the duration of the COVID-19 declaration under Section 564(b)(1) of the Act, 21 U.S.C.section 360bbb-3(b)(1), unless the authorization is terminated  or revoked sooner.       Influenza A by PCR NEGATIVE NEGATIVE Final   Influenza B by PCR NEGATIVE NEGATIVE Final    Comment: (NOTE) The Xpert Xpress SARS-CoV-2/FLU/RSV plus assay is intended as an aid in the diagnosis of influenza from Nasopharyngeal swab specimens and should not be  used as a sole basis for treatment. Nasal washings and aspirates are unacceptable for Xpert Xpress SARS-CoV-2/FLU/RSV testing.  Fact Sheet for Patients: BloggerCourse.comhttps://www.fda.gov/media/152166/download  Fact Sheet for Healthcare Providers: SeriousBroker.ithttps://www.fda.gov/media/152162/download  This test is not yet approved or cleared by the [place] FDA and has been authorized for detection and/or diagnosis of SARS-CoV-2 by FDA under an Emergency Use Authorization (EUA). This EUA will remain in effect (meaning this test can be used) for the duration of the COVID-19 declaration under Section 564(b)(1) of the Act, 21 U.S.C. section 360bbb-3(b)(1), unless the authorization is terminated or revoked.  Performed at [facility], [address] [place], [place] [address]   Surgical pcr screen     Status: Abnormal   Collection Time: 01/03/21  5:38 AM   Specimen: Nasal Mucosa; Nasal Swab  Result Value Ref Range Status   MRSA, PCR NEGATIVE NEGATIVE Final   Staphylococcus aureus POSITIVE (A) NEGATIVE Final    Comment: (NOTE) The Xpert SA Assay (FDA approved for NASAL specimens in patients 40 years of age and older), is one component of a comprehensive surveillance program. It is not intended to diagnose infection nor to guide or monitor treatment. Performed at [facility], [address] [place], [place] [address]   Aerobic/Anaerobic Culture w Gram Stain (surgical/deep wound)     Status: None   Collection Time: 01/03/21  4:26 PM   Specimen: Abscess  Result Value Ref Range Status   Specimen Description ABSCESS  Final   Special Requests PERIRECTAL ABSCESS SPEC A  Final   Gram Stain   Final    RARE WBC PRESENT,BOTH PMN AND MONONUCLEAR FEW GRAM NEGATIVE RODS    Culture   Final    FEW ESCHERICHIA COLI FEW BACTEROIDES OVATUS FEW BACTEROIDES THETAIOTAOMICRON BETA LACTAMASE POSITIVE Performed at [facility], [address] [place], [place] [address]    Report Status  01/06/2021 FINAL  Final   Organism ID, Bacteria ESCHERICHIA COLI  Final      Susceptibility   Escherichia coli - MIC*    AMPICILLIN <=2 SENSITIVE Sensitive     CEFAZOLIN <=4 SENSITIVE Sensitive     CEFEPIME <=0.12 SENSITIVE Sensitive     CEFTAZIDIME <=1 SENSITIVE Sensitive     CEFTRIAXONE <=0.25 SENSITIVE Sensitive     CIPROFLOXACIN <=0.25 SENSITIVE Sensitive     GENTAMICIN <=1 SENSITIVE Sensitive     IMIPENEM <=0.25 SENSITIVE Sensitive     TRIMETH/SULFA <=20 SENSITIVE Sensitive     AMPICILLIN/SULBACTAM <=2 SENSITIVE Sensitive     PIP/TAZO <=4 SENSITIVE Sensitive     * FEW ESCHERICHIA COLI         Radiology Studies: DG Chest 2 View  Result Date: 01/10/2021 CLINICAL DATA:  Hypoxia and constipation EXAM: CHEST - 2 VIEW COMPARISON:  January 08, 2021 FINDINGS: The cardiomediastinal silhouette is unchanged in contour. No pleural effusion. No pneumothorax. Revisualization of diffuse bilateral heterogeneous  opacities with relative subpleural sparing. Visualized abdomen is unremarkable. No acute osseous abnormality. IMPRESSION: Similar appearance of diffuse bilateral airspace opacities with relative subpleural sparing. This could reflect multifocal infection but could also be seen in the setting of inhalational injury and diffuse alveolar hemorrhage (nonspecific etiology). Electronically Signed   By: Meda Klinefelter MD   On: 01/10/2021 16:33        Scheduled Meds:  azithromycin  500 mg Oral Daily   furosemide  40 mg Intravenous Daily   potassium chloride  40 mEq Oral Q4H    Continuous Infusions:  cefTRIAXone (ROCEPHIN)  IV 2 g (01/10/21 1754)      LOS: 8 days     Jacquelin Hawking, MD [facility] 01/11/2021, 10:17 AM  If 7PM-7AM, please contact night-coverage [email or web address]

## 2021-01-12 LAB — BASIC METABOLIC PANEL
Anion gap: 7 (ref 5–15)
BUN: 7 mg/dL (ref 6–20)
CO2: 21 mmol/L — ABNORMAL LOW (ref 22–32)
Calcium: 8.5 mg/dL — ABNORMAL LOW (ref 8.9–10.3)
Chloride: 103 mmol/L (ref 98–111)
Creatinine, Ser: 0.66 mg/dL (ref 0.61–1.24)
GFR, Estimated: >60 mL/min (ref >60)
Glucose, Bld: 123 mg/dL — ABNORMAL HIGH (ref 70–99)
Potassium: 3.9 mmol/L (ref 3.5–5.1)
Sodium: 131 mmol/L — ABNORMAL LOW (ref 135–145)

## 2021-01-12 LAB — PROCALCITONIN: Procalcitonin: 0.36 ng/mL

## 2021-01-12 NOTE — Progress Notes (Signed)
PROGRESS NOTE    Trevor Dixon  AJG:[id number] DOB: 1981/05/02 DOA: 01/02/2021 PCP: System, Provider Not In   Brief Narrative: Trevor Dixon is a 40 y.o. male with no past medical history.  Patient presented secondary to constipation and right buttock pain was found to have a perirectal abscess.  General surgery was consulted and performed an I&D on 7/15.  Patient developed hemoptysis with associated respiratory failure but is now improving with IV Lasix.  Unknown certain etiology of his hemoptysis.   Assessment & Plan:   Principal Problem:   Sepsis (HCC) Active Problems:   Rectal pain   LFT elevation   Alcohol use   Acute respiratory failure with hypoxia (HCC)   Hemoptysis   Sepsis Present on admission. Secondary to perirectal abscess. Management below.  Perirectal abscess Patient started empirically on Zosyn, transitioned to Ceftriaxone and Flagyl. General surgery consulted and performed I&D on 7/15; culture significant for E. Coli which was pan-sensitive. Patient continued on Ceftriaxone and flagyl and has completed course.  Acute respiratory failure with hypoxia Secondary to hemoptysis. Patient has needed as much as 15 lpm via HFNC. CT chest scan significant for diffuse airspace disease concerning for possible pulmonary edema. Patient had initial improvement with Lasix. Transthoracic Echocardiogram without LV dysfunction. PT and OT recommending no follow-up. Appears to have tolerated 6 L/min of O2 yesterday; does not appears there were attempts to wean yesterday -Continue Lasix IV daily -Watch UOP while on Lasix -Wean oxygen to room air as able  Multifocal infiltrates Possible pneumonia. Procalcitonin is moderately elevated. WBC is slightly elevated still, although improved from before. -Continue Ceftriaxone and azithromcycin  Hemoptysis Unknown etiology. Pulmonology consulted. Initially thought secondary to epistaxis, however ENT was consulted and bedside scope was  negative for bleeding source. Symptoms improved but still with streaking blood in sputum today. Pulmonology considered bronchoscopy but deferred at this time. Hemoglobin is stable. Hemoptysis appears to be resolved.   DVT prophylaxis: SCDs Code Status:   Code Status: Full Code Family Communication: None at bedside Disposition Plan: Discharge home when able to wean oxygen. Likely 2-5 days   Consultants:  PCCM Otolaryngology  Procedures:  TRANSTHORACIC ECHOCARDIOGRAM (01/06/2021) IMPRESSIONS     1. Left ventricular ejection fraction, by estimation, is 55 to 60%. The  left ventricle has normal function. The left ventricle has no regional  wall motion abnormalities. Left ventricular diastolic parameters were  normal.   2. Right ventricular systolic function is normal. The right ventricular  size is normal.   3. The mitral valve is normal in structure. No evidence of mitral valve  regurgitation. No evidence of mitral stenosis.   4. The aortic valve is normal in structure. Aortic valve regurgitation is  not visualized. No aortic stenosis is present.   5. The inferior vena cava is normal in size with greater than 50%  respiratory variability, suggesting right atrial pressure of 3 mmHg.  LARYNGOSCOPY (01/04/2021)  Antimicrobials: Vancomycin Zosyn Ceftriaxone Flagyl  Ceftriaxone Azithromycin   Subjective:  Spanish interpreter: Elita Quick [phone]  Pleuritic chest pain. Hemoptysis appears to have resolved.   Objective: Vitals:   01/11/21 1955 01/12/21 0008 01/12/21 0344 01/12/21 0700  BP:   121/82 (!) 128/58  Pulse:  87 68 64  Resp:   17 19  Temp: 98.2 F (36.8 C) 98.3 F (36.8 C) 98.2 F (36.8 C) 99 F (37.2 C)  TempSrc: Oral Oral Oral Oral  SpO2:   93% 98%  Weight:      Height:  Intake/Output Summary (Last 24 hours) at 01/12/2021 0857 Last data filed at 01/12/2021 0500 Gross per 24 hour  Intake --  Output 2400 ml  Net -2400 ml    Filed Weights   01/02/21  1134  Weight: 68 kg    Examination:  General exam: Appears calm and comfortable Respiratory system: Clear to auscultation except for mild bibasilar rales. Respiratory effort normal. Cardiovascular system: S1 & S2 heard, RRR. No murmurs, rubs, gallops or clicks. Gastrointestinal system: Abdomen is nondistended, soft and nontender. No organomegaly or masses felt. Normal bowel sounds heard. Central nervous system: Alert and oriented. No focal neurological deficits. Musculoskeletal: No edema. No calf tenderness Skin: No cyanosis. No rashes Psychiatry: Judgement and insight appear normal. Mood & affect appropriate.    Data Reviewed: I have personally reviewed following labs and imaging studies  CBC Lab Results  Component Value Date   WBC 12.4 (H) 01/10/2021   RBC 3.85 (L) 01/10/2021   HGB 12.2 (L) 01/10/2021   HCT 36.1 (L) 01/10/2021   MCV 93.8 01/10/2021   MCH 31.7 01/10/2021   PLT 330 01/10/2021   MCHC 33.8 01/10/2021   RDW 13.2 01/10/2021   LYMPHSABS 2.5 01/07/2021   MONOABS 0.8 01/07/2021   EOSABS 0.3 01/07/2021   BASOSABS 0.1 01/07/2021     Last metabolic panel Lab Results  Component Value Date   NA 131 (L) 01/12/2021   K 3.9 01/12/2021   CL 103 01/12/2021   CO2 21 (L) 01/12/2021   BUN 7 01/12/2021   CREATININE 0.66 01/12/2021   GLUCOSE 123 (H) 01/12/2021   GFRNONAA >60 01/12/2021   GFRAA >60 07/14/2016   CALCIUM 8.5 (L) 01/12/2021   PROT 7.9 01/03/2021   ALBUMIN 3.0 (L) 01/03/2021   BILITOT 0.9 01/03/2021   ALKPHOS 117 01/03/2021   AST 99 (H) 01/03/2021   ALT 45 (H) 01/03/2021   ANIONGAP 7 01/12/2021    CBG (last 3)  No results for input(s): GLUCAP in the last 72 hours.   GFR: Estimated Creatinine Clearance: 110.8 mL/min (by C-G formula based on SCr of 0.66 mg/dL).  Coagulation Profile: No results for input(s): INR, PROTIME in the last 168 hours.   Recent Results (from the past 240 hour(s))  Resp Panel by RT-PCR (Flu A&B, Covid) Nasopharyngeal  Swab     Status: None   Collection Time: 01/02/21  7:48 PM   Specimen: Nasopharyngeal Swab; Nasopharyngeal(NP) swabs in vial transport medium  Result Value Ref Range Status   SARS Coronavirus 2 by RT PCR NEGATIVE NEGATIVE Final    Comment: (NOTE) SARS-CoV-2 target nucleic acids are NOT DETECTED.  The SARS-CoV-2 RNA is generally detectable in upper respiratory specimens during the acute phase of infection. The lowest concentration of SARS-CoV-2 viral copies this assay can detect is 138 copies/mL. A negative result does not preclude SARS-Cov-2 infection and should not be used as the sole basis for treatment or other patient management decisions. A negative result may occur with  improper specimen collection/handling, submission of specimen other than nasopharyngeal swab, presence of viral mutation(s) within the areas targeted by this assay, and inadequate number of viral copies(<138 copies/mL). A negative result must be combined with clinical observations, patient history, and epidemiological information. The expected result is Negative.  Fact Sheet for Patients:  BloggerCourse.com  Fact Sheet for Healthcare Providers:  SeriousBroker.it  This test is no t yet approved or cleared by the [place] FDA and  has been authorized for detection and/or diagnosis of SARS-CoV-2 by FDA under an  Emergency Use Authorization (EUA). This EUA will remain  in effect (meaning this test can be used) for the duration of the COVID-19 declaration under Section 564(b)(1) of the Act, 21 U.S.C.section 360bbb-3(b)(1), unless the authorization is terminated  or revoked sooner.       Influenza A by PCR NEGATIVE NEGATIVE Final   Influenza B by PCR NEGATIVE NEGATIVE Final    Comment: (NOTE) The Xpert Xpress SARS-CoV-2/FLU/RSV plus assay is intended as an aid in the diagnosis of influenza from Nasopharyngeal swab specimens and should not be used as a sole  basis for treatment. Nasal washings and aspirates are unacceptable for Xpert Xpress SARS-CoV-2/FLU/RSV testing.  Fact Sheet for Patients: BloggerCourse.com  Fact Sheet for Healthcare Providers: SeriousBroker.it  This test is not yet approved or cleared by the [place] FDA and has been authorized for detection and/or diagnosis of SARS-CoV-2 by FDA under an Emergency Use Authorization (EUA). This EUA will remain in effect (meaning this test can be used) for the duration of the COVID-19 declaration under Section 564(b)(1) of the Act, 21 U.S.C. section 360bbb-3(b)(1), unless the authorization is terminated or revoked.  Performed at [facility], [address] [place], [place] [address]   Surgical pcr screen     Status: Abnormal   Collection Time: 01/03/21  5:38 AM   Specimen: Nasal Mucosa; Nasal Swab  Result Value Ref Range Status   MRSA, PCR NEGATIVE NEGATIVE Final   Staphylococcus aureus POSITIVE (A) NEGATIVE Final    Comment: (NOTE) The Xpert SA Assay (FDA approved for NASAL specimens in patients 83 years of age and older), is one component of a comprehensive surveillance program. It is not intended to diagnose infection nor to guide or monitor treatment. Performed at [facility], [address], [place], [place] [address]   Aerobic/Anaerobic Culture w Gram Stain (surgical/deep wound)     Status: None   Collection Time: 01/03/21  4:26 PM   Specimen: Abscess  Result Value Ref Range Status   Specimen Description ABSCESS  Final   Special Requests PERIRECTAL ABSCESS SPEC A  Final   Gram Stain   Final    RARE WBC PRESENT,BOTH PMN AND MONONUCLEAR FEW GRAM NEGATIVE RODS    Culture   Final    FEW ESCHERICHIA COLI FEW BACTEROIDES OVATUS FEW BACTEROIDES THETAIOTAOMICRON BETA LACTAMASE POSITIVE Performed at [facility], [address], [place], [place] [address]    Report Status 01/06/2021 FINAL  Final    Organism ID, Bacteria ESCHERICHIA COLI  Final      Susceptibility   Escherichia coli - MIC*    AMPICILLIN <=2 SENSITIVE Sensitive     CEFAZOLIN <=4 SENSITIVE Sensitive     CEFEPIME <=0.12 SENSITIVE Sensitive     CEFTAZIDIME <=1 SENSITIVE Sensitive     CEFTRIAXONE <=0.25 SENSITIVE Sensitive     CIPROFLOXACIN <=0.25 SENSITIVE Sensitive     GENTAMICIN <=1 SENSITIVE Sensitive     IMIPENEM <=0.25 SENSITIVE Sensitive     TRIMETH/SULFA <=20 SENSITIVE Sensitive     AMPICILLIN/SULBACTAM <=2 SENSITIVE Sensitive     PIP/TAZO <=4 SENSITIVE Sensitive     * FEW ESCHERICHIA COLI         Radiology Studies: DG Chest 2 View  Result Date: 01/10/2021 CLINICAL DATA:  Hypoxia and constipation EXAM: CHEST - 2 VIEW COMPARISON:  January 08, 2021 FINDINGS: The cardiomediastinal silhouette is unchanged in contour. No pleural effusion. No pneumothorax. Revisualization of diffuse bilateral heterogeneous opacities with relative subpleural sparing. Visualized abdomen  is unremarkable. No acute osseous abnormality. IMPRESSION: Similar appearance of diffuse bilateral airspace opacities with relative subpleural sparing. This could reflect multifocal infection but could also be seen in the setting of inhalational injury and diffuse alveolar hemorrhage (nonspecific etiology). Electronically Signed   By: Meda KlinefelterStephanie  Peacock MD   On: 01/10/2021 16:33        Scheduled Meds:  azithromycin  500 mg Oral Daily   furosemide  40 mg Intravenous Daily    Continuous Infusions:  cefTRIAXone (ROCEPHIN)  IV 2 g (01/10/21 1754)      LOS: 9 days     Jacquelin Hawkingalph Anselma Herbel, MD [facility] 01/12/2021, 8:57 AM  If 7PM-7AM, please contact night-coverage [email or web address]

## 2021-01-13 ENCOUNTER — Encounter (HOSPITAL_COMMUNITY): Payer: Self-pay | Admitting: General Surgery

## 2021-01-13 LAB — BASIC METABOLIC PANEL
Anion gap: 9 (ref 5–15)
BUN: 11 mg/dL (ref 6–20)
CO2: 21 mmol/L — ABNORMAL LOW (ref 22–32)
Calcium: 8.6 mg/dL — ABNORMAL LOW (ref 8.9–10.3)
Chloride: 100 mmol/L (ref 98–111)
Creatinine, Ser: 0.6 mg/dL — ABNORMAL LOW (ref 0.61–1.24)
GFR, Estimated: >60 mL/min (ref >60)
Glucose, Bld: 108 mg/dL — ABNORMAL HIGH (ref 70–99)
Potassium: 3.6 mmol/L (ref 3.5–5.1)
Sodium: 130 mmol/L — ABNORMAL LOW (ref 135–145)

## 2021-01-13 LAB — CBC
HCT: 37.5 % — ABNORMAL LOW (ref 39.0–52.0)
Hemoglobin: 12.6 g/dL — ABNORMAL LOW (ref 13.0–17.0)
MCH: 31.2 pg (ref 26.0–34.0)
MCHC: 33.6 g/dL (ref 30.0–36.0)
MCV: 92.8 fL (ref 80.0–100.0)
Platelets: 435 10*3/uL — ABNORMAL HIGH (ref 150–400)
RBC: 4.04 MIL/uL — ABNORMAL LOW (ref 4.22–5.81)
RDW: 12.9 % (ref 11.5–15.5)
WBC: 15.8 10*3/uL — ABNORMAL HIGH (ref 4.0–10.5)
nRBC: 0 % (ref 0.0–0.2)

## 2021-01-13 MED ORDER — 0.9 % SODIUM CHLORIDE (POUR BTL) OPTIME
TOPICAL | Status: DC | PRN
Start: 2021-01-03 — End: 2021-01-13
  Administered 2021-01-03: 1000 mL

## 2021-01-13 NOTE — Progress Notes (Signed)
PROGRESS NOTE    Trevor Dixon  QQV:[id number] DOB: [birth year] DOA: 01/02/2021 PCP: System, Provider Not In   Brief Narrative: Trevor Dixon is a 40 y.o. male with no past medical history.  Patient presented secondary to constipation and right buttock pain was found to have a perirectal abscess.  General surgery was consulted and performed an I&D on 7/15.  Patient developed hemoptysis with associated respiratory failure but is now improving with IV Lasix.  Unknown certain etiology of his hemoptysis.   Assessment & Plan:   Principal Problem:   Sepsis (HCC) Active Problems:   Rectal pain   LFT elevation   Alcohol use   Acute respiratory failure with hypoxia (HCC)   Hemoptysis   Sepsis Present on admission. Secondary to perirectal abscess. Management below.  Perirectal abscess Patient started empirically on Zosyn, transitioned to Ceftriaxone and Flagyl. General surgery consulted and performed I&D on 7/15; culture significant for E. Coli which was pan-sensitive. Patient continued on Ceftriaxone and flagyl and has completed course.  Acute respiratory failure with hypoxia Secondary to hemoptysis. Patient has needed as much as 15 lpm via HFNC. CT chest scan significant for diffuse airspace disease concerning for possible pulmonary edema. Patient had initial improvement with Lasix. Transthoracic Echocardiogram without LV dysfunction. PT and OT recommending no follow-up. Appears to have tolerated 6 L/min of O2 yesterday; does not appears there were attempts to wean yesterday -Continue Lasix IV daily -Watch UOP while on Lasix -Wean oxygen to room air as able -Ambulatory pulse oximetry  Multifocal infiltrates Possible pneumonia. Procalcitonin is moderately elevated. WBC is slightly elevated still, although improved from before. -Continue Ceftriaxone and azithromcycin  Hemoptysis Unknown etiology. Pulmonology consulted. Initially thought secondary to epistaxis, however ENT was  consulted and bedside scope was negative for bleeding source. Symptoms improved but still with streaking blood in sputum today. Pulmonology considered bronchoscopy but deferred at this time. Hemoglobin is stable. Hemoptysis appears to be resolved.   DVT prophylaxis: SCDs Code Status:   Code Status: Full Code Family Communication: None at bedside Disposition Plan: Discharge home when able to wean oxygen. Likely 1-2 days   Consultants:  PCCM Otolaryngology  Procedures:  TRANSTHORACIC ECHOCARDIOGRAM (01/06/2021) IMPRESSIONS     1. Left ventricular ejection fraction, by estimation, is 55 to 60%. The  left ventricle has normal function. The left ventricle has no regional  wall motion abnormalities. Left ventricular diastolic parameters were  normal.   2. Right ventricular systolic function is normal. The right ventricular  size is normal.   3. The mitral valve is normal in structure. No evidence of mitral valve  regurgitation. No evidence of mitral stenosis.   4. The aortic valve is normal in structure. Aortic valve regurgitation is  not visualized. No aortic stenosis is present.   5. The inferior vena cava is normal in size with greater than 50%  respiratory variability, suggesting right atrial pressure of 3 mmHg.  LARYNGOSCOPY (01/04/2021)  Antimicrobials: Vancomycin Zosyn Ceftriaxone Flagyl  Ceftriaxone Azithromycin   Subjective:  Spanish interpreter: Anastasia Pall [phone]  Some dyspnea with coughing. Did not sleep too well last night.  Objective: Vitals:   01/13/21 0500 01/13/21 0700 01/13/21 0800 01/13/21 0900  BP: (!) 104/54 (!) 108/58 (!) 112/58 121/71  Pulse: 63 71 71 69  Resp: (!) 26 20 18    Temp:   98.5 F (36.9 C)   TempSrc:   Oral   SpO2: 98% 95% 97% 94%  Weight:      Height:  Intake/Output Summary (Last 24 hours) at 01/13/2021 1012 Last data filed at 01/13/2021 0900 Gross per 24 hour  Intake 120 ml  Output 552 ml  Net -432 ml    Filed  Weights   01/02/21 1134  Weight: 68 kg    Examination:  General exam: Appears calm and comfortable Respiratory system: Clear to auscultation except for mild bibasilar rales. Respiratory effort normal. Cardiovascular system: S1 & S2 heard, RRR. No murmurs, rubs, gallops or clicks. Gastrointestinal system: Abdomen is nondistended, soft and nontender. No organomegaly or masses felt. Normal bowel sounds heard. Central nervous system: Alert and oriented. No focal neurological deficits. Musculoskeletal: No edema. No calf tenderness Skin: No cyanosis. No rashes Psychiatry: Judgement and insight appear normal. Mood & affect appropriate.    Data Reviewed: I have personally reviewed following labs and imaging studies  CBC Lab Results  Component Value Date   WBC 15.8 (H) 01/13/2021   RBC 4.04 (L) 01/13/2021   HGB 12.6 (L) 01/13/2021   HCT 37.5 (L) 01/13/2021   MCV 92.8 01/13/2021   MCH 31.2 01/13/2021   PLT 435 (H) 01/13/2021   MCHC 33.6 01/13/2021   RDW 12.9 01/13/2021   LYMPHSABS 2.5 01/07/2021   MONOABS 0.8 01/07/2021   EOSABS 0.3 01/07/2021   BASOSABS 0.1 01/07/2021     Last metabolic panel Lab Results  Component Value Date   NA 130 (L) 01/13/2021   K 3.6 01/13/2021   CL 100 01/13/2021   CO2 21 (L) 01/13/2021   BUN 11 01/13/2021   CREATININE 0.60 (L) 01/13/2021   GLUCOSE 108 (H) 01/13/2021   GFRNONAA >60 01/13/2021   GFRAA >60 07/14/2016   CALCIUM 8.6 (L) 01/13/2021   PROT 7.9 01/03/2021   ALBUMIN 3.0 (L) 01/03/2021   BILITOT 0.9 01/03/2021   ALKPHOS 117 01/03/2021   AST 99 (H) 01/03/2021   ALT 45 (H) 01/03/2021   ANIONGAP 9 01/13/2021    CBG (last 3)  No results for input(s): GLUCAP in the last 72 hours.   GFR: Estimated Creatinine Clearance: 110.8 mL/min (A) (by C-G formula based on SCr of 0.6 mg/dL (L)).  Coagulation Profile: No results for input(s): INR, PROTIME in the last 168 hours.   Recent Results (from the past 240 hour(s))  Aerobic/Anaerobic  Culture w Gram Stain (surgical/deep wound)     Status: None   Collection Time: 01/03/21  4:26 PM   Specimen: Abscess  Result Value Ref Range Status   Specimen Description ABSCESS  Final   Special Requests PERIRECTAL ABSCESS SPEC A  Final   Gram Stain   Final    RARE WBC PRESENT,BOTH PMN AND MONONUCLEAR FEW GRAM NEGATIVE RODS    Culture   Final    FEW ESCHERICHIA COLI FEW BACTEROIDES OVATUS FEW BACTEROIDES THETAIOTAOMICRON BETA LACTAMASE POSITIVE Performed at [facility], [address], [place], [place] [address]    Report Status 01/06/2021 FINAL  Final   Organism ID, Bacteria ESCHERICHIA COLI  Final      Susceptibility   Escherichia coli - MIC*    AMPICILLIN <=2 SENSITIVE Sensitive     CEFAZOLIN <=4 SENSITIVE Sensitive     CEFEPIME <=0.12 SENSITIVE Sensitive     CEFTAZIDIME <=1 SENSITIVE Sensitive     CEFTRIAXONE <=0.25 SENSITIVE Sensitive     CIPROFLOXACIN <=0.25 SENSITIVE Sensitive     GENTAMICIN <=1 SENSITIVE Sensitive     IMIPENEM <=0.25 SENSITIVE Sensitive     TRIMETH/SULFA <=20 SENSITIVE Sensitive     AMPICILLIN/SULBACTAM <=2 SENSITIVE Sensitive  PIP/TAZO <=4 SENSITIVE Sensitive     * FEW ESCHERICHIA COLI         Radiology Studies: No results found.      Scheduled Meds:  azithromycin  500 mg Oral Daily   furosemide  40 mg Intravenous Daily    Continuous Infusions:  cefTRIAXone (ROCEPHIN)  IV 2 g (01/12/21 1641)      LOS: 10 days     Jacquelin Hawking, MD [facility] 01/13/2021, 10:12 AM  If 7PM-7AM, please contact night-coverage [email or web address]

## 2021-01-13 NOTE — Progress Notes (Signed)
SATURATION QUALIFICATIONS: (This note is used to comply with regulatory documentation for home oxygen)  Patient Saturations on Room Air at Rest = 95%  Patient Saturations on Room Air while Ambulating = 89%  Patient Saturations on -- Liters of oxygen while Ambulating = --%   Patient did not need oxygen to maintain sats >87% during functional activity.    Jerolyn Center, PT Pager [phone]

## 2021-01-13 NOTE — Progress Notes (Signed)
Physical Therapy Treatment Patient Details Name: Trevor Dixon MRN: [id number] DOB: 1980-07-26 Today's Date: 01/13/2021    History of Present Illness Pt is a 40 y.o. male presenting to ED 7/14 with constipation and R buttock pain. Patient admitted with perirectal abscess and sepsis s/p I&D 7/15. Hospital admission complicated by hemoptysis of unknown etiology with associated respiratory failure improving with IV lasix. Suspicion for possible pulmonary edema. No significant PMHx noted.    PT Comments    Patient tolerating ambulation on room air with lowest saturation 89%. After walking 300 ft pt reporting spinning-type dizziness that lasted >3 minutes. Denies ear or sinus discomfort. Denies h/o vertigo. Feels he is just weak from 12 days in hospital and that is cause of his dizziness. Unclear source of vertigo at this time.     Follow Up Recommendations  No PT follow up     Equipment Recommendations  None recommended by PT    Recommendations for Other Services       Precautions / Restrictions Precautions Precautions: Fall Precaution Comments: Monitor SpO2    Mobility  Bed Mobility Overal bed mobility: Independent Bed Mobility: Supine to Sit     Supine to sit: Independent          Transfers Overall transfer level: Independent Equipment used: None Transfers: Sit to/from [employer] to Stand: Independent Stand pivot transfers: Independent       General transfer comment: denied dizziness  Ambulation/Gait Ambulation/Gait assistance: Supervision Gait Distance (Feet): 300 Feet Assistive device: None Gait Pattern/deviations: WFL(Within Functional Limits)   Gait velocity interpretation: >2.62 ft/sec, indicative of community ambulatory General Gait Details: on room air with sats 89+% throughout; on return to room pt reported vertigo/spinning feeling. No nystagmus noted. Spinning persisted >3 minutes. Pt denies hearing changes or ear discomfort;  denies sinus congestion; kept insisting he felt dizziness was due to weakness and not having moved much for 12 days   Stairs             Wheelchair Mobility    Modified Rankin (Stroke Patients Only)       Balance Overall balance assessment: Needs assistance Sitting-balance support: Feet supported Sitting balance-Leahy Scale: Good Sitting balance - Comments: Reports dizziness upon sitting at EOB.   Standing balance support: During functional activity Standing balance-Leahy Scale: Good                              Cognition Arousal/Alertness: Awake/alert Behavior During Therapy: WFL for tasks assessed/performed Overall Cognitive Status: Within Functional Limits for tasks assessed                                        Exercises      General Comments        Pertinent Vitals/Pain Pain Assessment: No/denies pain    Home Living                      Prior Function            PT Goals (current goals can now be found in the care plan section) Acute Rehab PT Goals Patient Stated Goal: To decrease pain. PT Goal Formulation: With patient Time For Goal Achievement: 01/23/21 Potential to Achieve Goals: Good Progress towards PT goals: Progressing toward goals    Frequency    Min 3X/week  PT Plan Current plan remains appropriate    Co-evaluation              AM-PAC PT "6 Clicks" Mobility   Outcome Measure  Help needed turning from your back to your side while in a flat bed without using bedrails?: None Help needed moving from lying on your back to sitting on the side of a flat bed without using bedrails?: None Help needed moving to and from a bed to a chair (including a wheelchair)?: None Help needed standing up from a chair using your arms (e.g., wheelchair or bedside chair)?: None Help needed to walk in hospital room?: A Little Help needed climbing 3-5 steps with a railing? : A Little 6 Click Score:  22    End of Session   Activity Tolerance: Patient tolerated treatment well Patient left: in chair;with call bell/phone within reach;with chair alarm set Nurse Communication: Mobility status;Other (comment) (O2 sats on RA) PT Visit Diagnosis: Difficulty in walking, not elsewhere classified (R26.2)     Time: 4360-6770 PT Time Calculation (min) (ACUTE ONLY): 21 min  Charges:  $Gait Training: 8-22 mins                      Jerolyn Center, PT Pager [phone]    Zena Amos 01/13/2021, 11:27 AM

## 2021-01-13 NOTE — Progress Notes (Signed)
Occupational Therapy Treatment Patient Details Name: Trevor Dixon MRN: [id number] DOB: [birth year] Today's Date: 01/13/2021    History of present illness Pt is a 40 y.o. male presenting to ED 7/14 with constipation and R buttock pain. Patient admitted with perirectal abscess and sepsis s/p I&D 7/15. Hospital admission complicated by hemoptysis of unknown etiology with associated respiratory failure improving with IV lasix. Suspicion for possible pulmonary edema. No significant PMHx noted.   OT comments  OT treatment session with focus on self-care re-education, energy conservation and activity tolerance. Verbal and written education provided on energy conservation in prep for ADLs/IADLs upon return home. Patient expressed verbal understanding. Household and community mobility 25ft+ with SpO2 93%-100% on RA. OT will continue to follow acutely to maximize safety/independence with self-care tasks in prep for safe d/c home.    Follow Up Recommendations  No OT follow up    Equipment Recommendations  None recommended by OT    Recommendations for Other Services      Precautions / Restrictions Precautions Precautions: Fall Precaution Comments: Monitor SpO2 Restrictions Weight Bearing Restrictions: No       Mobility Bed Mobility Overal bed mobility: Independent Bed Mobility: Supine to Sit     Supine to sit: Independent     General bed mobility comments: Seated in recliner upon entry.    Transfers Overall transfer level: Independent Equipment used: None Transfers: Sit to/from [employer] to Stand: Independent Stand pivot transfers: Independent       General transfer comment: denied dizziness    Balance Overall balance assessment: Needs assistance Sitting-balance support: Feet supported Sitting balance-Leahy Scale: Good Sitting balance - Comments: Reports dizziness upon sitting at EOB.   Standing balance support: During functional  activity Standing balance-Leahy Scale: Good                             ADL either performed or assessed with clinical judgement   ADL Overall ADL's : Needs assistance/impaired                                       General ADL Comments: Grossly supervision A for line management.     Vision       Perception     Praxis      Cognition Arousal/Alertness: Awake/alert Behavior During Therapy: WFL for tasks assessed/performed Overall Cognitive Status: Within Functional Limits for tasks assessed                                          Exercises     Shoulder Instructions       General Comments Maintains SpO2 >90% on RA at rest and with mobility up to 212ft.    Pertinent Vitals/ Pain       Pain Assessment: 0-10 Pain Score: 2  Pain Location: chest and L flank with coughing Pain Descriptors / Indicators: Sore;Discomfort Pain Intervention(s): Monitored during session  Home Living                                          Prior Functioning/Environment              Frequency  Min 2X/week        Progress Toward Goals  OT Goals(current goals can now be found in the care plan section)  Progress towards OT goals: Progressing toward goals  Acute Rehab OT Goals Patient Stated Goal: To decrease pain. OT Goal Formulation: With patient Time For Goal Achievement: 01/23/21 Potential to Achieve Goals: Good ADL Goals Additional ADL Goal #1: Patient will complete ADLs with Mod I and SpO2 >90% on 2L O2 via [place]. Additional ADL Goal #2: Patient will recall 3 energy conservation techniques in prep for ADLs/IADLs.  Plan Discharge plan remains appropriate;Frequency remains appropriate    Co-evaluation                 AM-PAC OT "6 Clicks" Daily Activity     Outcome Measure   Help from another person eating meals?: None Help from another person taking care of personal grooming?: None Help from another  person toileting, which includes using toliet, bedpan, or urinal?: A Little Help from another person bathing (including washing, rinsing, drying)?: A Little Help from another person to put on and taking off regular upper body clothing?: None Help from another person to put on and taking off regular lower body clothing?: A Little 6 Click Score: 21    End of Session Equipment Utilized During Treatment: Gait belt  OT Visit Diagnosis: Unsteadiness on feet (R26.81);Muscle weakness (generalized) (M62.81)   Activity Tolerance Patient tolerated treatment well   Patient Left in chair;with call bell/phone within reach;with chair alarm set   Nurse Communication Mobility status        Time: 0093-8182 OT Time Calculation (min): 23 min  Charges: OT General Charges $OT Visit: 1 Visit OT Treatments $Therapeutic Activity: 23-37 mins  Trevor Everett H. OTR/L Supplemental OT, Department of rehab services [phone]   Trevor Fentress R H. 01/13/2021, 1:18 PM

## 2021-01-13 NOTE — Plan of Care (Addendum)
Patient now has no O2 requirement. Potential discharge to home tomorrow. No acute events overnight.   Problem: Education: Goal: Knowledge of General Education information will improve Description: Including pain rating scale, medication(s)/side effects and non-pharmacologic comfort measures Outcome: Progressing   Problem: Health Behavior/Discharge Planning: Goal: Ability to manage health-related needs will improve Outcome: Progressing   Problem: Clinical Measurements: Goal: Ability to maintain clinical measurements within normal limits will improve Outcome: Progressing Goal: Will remain free from infection Outcome: Progressing Goal: Diagnostic test results will improve Outcome: Progressing Goal: Respiratory complications will improve Outcome: Progressing Goal: Cardiovascular complication will be avoided Outcome: Progressing   Problem: Activity: Goal: Risk for activity intolerance will decrease Outcome: Progressing   Problem: Nutrition: Goal: Adequate nutrition will be maintained Outcome: Progressing   Problem: Coping: Goal: Level of anxiety will decrease Outcome: Progressing   Problem: Elimination: Goal: Will not experience complications related to bowel motility Outcome: Progressing Goal: Will not experience complications related to urinary retention Outcome: Progressing   Problem: Pain Managment: Goal: General experience of comfort will improve Outcome: Progressing   Problem: Safety: Goal: Ability to remain free from injury will improve Outcome: Progressing   Problem: Skin Integrity: Goal: Risk for impaired skin integrity will decrease Outcome: Progressing

## 2021-01-14 ENCOUNTER — Other Ambulatory Visit (HOSPITAL_COMMUNITY): Payer: Self-pay

## 2021-01-14 DIAGNOSIS — J189 Pneumonia, unspecified organism: Secondary | ICD-10-CM

## 2021-01-14 MED ORDER — FUROSEMIDE 40 MG PO TABS
40.0000 mg | ORAL_TABLET | Freq: Two times a day (BID) | ORAL | 0 refills | Status: AC
  Filled 2021-01-14: qty 10, 5d supply, fill #0

## 2021-01-14 MED ORDER — DEXTROMETHORPHAN-GUAIFENESIN 10-100 MG/5ML PO SYRP
5.0000 mL | ORAL_SOLUTION | ORAL | 0 refills | Status: AC | PRN
  Filled 2021-01-14: qty 118, 4d supply, fill #0

## 2021-01-14 MED ORDER — AMOXICILLIN-POT CLAVULANATE 875-125 MG PO TABS
1.0000 | ORAL_TABLET | Freq: Two times a day (BID) | ORAL | 0 refills | Status: AC
  Filled 2021-01-14: qty 6, 3d supply, fill #0

## 2021-01-14 NOTE — Discharge Instructions (Signed)
[name],  You were in the hospital for an abscess. The abscess was drained and you have finished your antibiotics. Unfortunately, you started to cough blood and your oxygen was very low. Thankfully your situation has improved by given you oxygen and Lasix. You also had pneumonia and will continue antibiotics at home. I recommend for you to return to work as able, but to allow yourself to rest frequently as needed.  Estabas en el hospital por un absceso. Se dren el absceso y usted termin sus antibiticos. Desafortunadamente, comenzaste a toser Retail buyer y tu oxgeno estaba muy bajo. Afortunadamente, su situacin ha mejorado al administrarle oxgeno y Lasix. Tambin tuvo neumona y Educational psychologist con los antibiticos en casa. Le recomiendo que regrese al trabajo lo ms que pueda, pero que se permita descansar con frecuencia segn sea necesario.  Sincerely,  Jacquelin Hawking, MD

## 2021-01-14 NOTE — Discharge Summary (Signed)
Physician Discharge Summary  Trevor Dixon MCN:[id number] DOB: [birth year] DOA: 01/02/2021  PCP: System, Provider Not In  Admit date: 01/02/2021 Discharge date: 01/14/2021  Admitted From: Home Disposition: Home  Recommendations for Outpatient Follow-up:  Follow up with PCP in 1 week Follow up with general surgery Please follow up on the following pending results: None  Home Health: None Equipment/Devices: None  Discharge Condition: Stable CODE STATUS: Full code Diet recommendation: Regular   Brief/Interim Summary:  Admission HPI written by Anselm Jungling, DO   HPI: Trevor Dixon is a 40 y.o. male with no significant past medical history presenting with concerns of constipation and right buttocks pain.    patient reports that for the past several days he has not been able to have a bowel movement . Does not usually have constipation.  Today developed worsening mid right buttocks pain that was so severe that he could not walk or get in and out of his car.  He also endorsed fever this morning.  He denies any nausea, or vomiting.  No abdominal pain.  He endorses drinking about three 24 ounce beers daily.  Denies any tobacco or illicit drug use.   Hospital course:  Sepsis Present on admission. Secondary to perirectal abscess. Blood cultures not obtained. Management below.   Perirectal abscess Patient started empirically on Zosyn, transitioned to Ceftriaxone and Flagyl. General surgery consulted and performed I&D on 7/15; culture significant for E. Coli which was pan-sensitive. Patient continued on Ceftriaxone and flagyl and has completed course. Follow-up with general surgery as an outpatient.   Acute respiratory failure with hypoxia Secondary to hemoptysis. Patient has needed as much as 15 lpm via HFNC. CT chest scan significant for diffuse airspace disease concerning for possible pulmonary edema. Transthoracic Echocardiogram without evidence of heart LV dysfunction. Patient  had improvement with Lasix. Transthoracic Echocardiogram without LV dysfunction. PT and OT recommending no follow-up. Patient weaned off oxygen and did not qualify for oxygen on discharge. Continue Lasix for 5 more days.   Multifocal pneumonia Possible pneumonia. Procalcitonin is moderately elevated. WBC is slightly elevated still, although improved from before. Patient empirically treated with Ceftriaxone and azithromycin. Discharged with Augmentin for three more days on discharge to complete a 7 day treatment course.   Hemoptysis Unknown etiology. Pulmonology consulted. Initially thought secondary to epistaxis, however ENT was consulted and bedside scope was negative for bleeding source. Symptoms improved but still with streaking blood in sputum today. Pulmonology considered bronchoscopy but deferred at this time. Hemoglobin is stable. Hemoptysis appears to be resolved.  Discharge Diagnoses:  Principal Problem:   Sepsis (HCC) Active Problems:   Rectal pain   LFT elevation   Alcohol use   Acute respiratory failure with hypoxia (HCC)   Hemoptysis    Discharge Instructions  Discharge Instructions     Call MD for:  difficulty breathing, headache or visual disturbances   Complete by: As directed    Call MD for:  severe uncontrolled pain   Complete by: As directed    Call MD for:  temperature >100.4   Complete by: As directed    Increase activity slowly   Complete by: As directed    No wound care   Complete by: As directed       Allergies as of 01/14/2021   No Known Allergies      Medication List     STOP taking these medications    HYDROcodone-acetaminophen 7.5-325 MG tablet Commonly known as: Norco   ondansetron 4 MG  tablet Commonly known as: ZOFRAN   oxyCODONE-acetaminophen 5-325 MG tablet Commonly known as: PERCOCET/ROXICET   senna-docusate 8.6-50 MG tablet Commonly known as: Senokot S   traMADol 50 MG tablet Commonly known as: ULTRAM       TAKE these  medications    amoxicillin-clavulanate 875-125 MG tablet Commonly known as: Augmentin Take 1 tablet by mouth 2 (two) times daily for 3 days.   furosemide 40 MG tablet Commonly known as: Lasix Take 1 tablet (40 mg total) by mouth 2 (two) times daily for 5 days.   guaiFENesin-dextromethorphan 100-10 MG/5ML syrup Commonly known as: ROBITUSSIN DM Take 5 mLs by mouth every 4 (four) hours as needed for cough.   oxyCODONE 5 MG immediate release tablet Commonly known as: Roxicodone Take 1 tablet (5 mg total) by mouth every 6 (six) hours as needed for breakthrough pain.        Follow-up Information     Sur[facility], [facility]. Schedule an appointment as soon as possible for a visit on 01/23/2021.   Specialty: General Surgery Why: 2:15 on 8/4. Please arrive 30 minutes early for paperwork. Please bring a copy of your photo ID and insurance card. Contact information: [address] [place] [address] [phone]         [facility]. Schedule an appointment as soon as possible for a visit in 2 week(s).   Why: For hospital follow-up Contact information: [address] [place] [address] [phone]               No Known Allergies  Consultations: General surgery Pulmonology ENT   Procedures/Studies: DG Orbits  Result Date: 01-13-2021 CLINICAL DATA:  Metal working/exposure; clearance prior to MRI EXAM: ORBITS - COMPLETE 4+ VIEW COMPARISON:  None. FINDINGS: There is no evidence of metallic foreign body within the orbits. No significant bone abnormality identified. IMPRESSION: No evidence of metallic foreign body within the orbits. Electronically Signed   By: Alcide Clever M.D.   On: 13-Jan-2021 01:54   DG Chest 2 View  Result Date: 01/10/2021 CLINICAL DATA:  Hypoxia and constipation EXAM: CHEST - 2 VIEW COMPARISON:  January 08, 2021 FINDINGS: The cardiomediastinal silhouette is unchanged in contour. No  pleural effusion. No pneumothorax. Revisualization of diffuse bilateral heterogeneous opacities with relative subpleural sparing. Visualized abdomen is unremarkable. No acute osseous abnormality. IMPRESSION: Similar appearance of diffuse bilateral airspace opacities with relative subpleural sparing. This could reflect multifocal infection but could also be seen in the setting of inhalational injury and diffuse alveolar hemorrhage (nonspecific etiology). Electronically Signed   By: Meda Klinefelter MD   On: 01/10/2021 16:33   CT CHEST W CONTRAST  Result Date: 01/04/2021 CLINICAL DATA:  Postoperative hemoptysis EXAM: CT CHEST WITH CONTRAST TECHNIQUE: Multidetector CT imaging of the chest was performed during intravenous contrast administration. CONTRAST:  OMNIPAQUE IOHEXOL 300 MG/ML  SOLN COMPARISON:  Radiograph same day FINDINGS: Cardiovascular: No significant vascular findings. Normal heart size. No pericardial effusion. Mediastinum/Nodes: No axillary or supraclavicular adenopathy. No mediastinal or hilar adenopathy. No pericardial fluid. Esophagus normal. Lungs/Pleura: There is diffuse perihilar airspace disease which reaches confluence the upper lobes and more patchy in the lower lobes. No pleural fluid. Airways normal. Upper Abdomen: Limited view of the liver, kidneys, pancreas are unremarkable. Normal adrenal glands. Musculoskeletal: No aggressive osseous lesion. IMPRESSION: 1. Diffuse bilateral perihilar airspace disease would be typical of flash pulmonary edema; however, with history of hemoptysis would also consider diffuse pulmonary hemorrhage. 2.  No pleural fluid. 3. Airways normal. 4. No cardiovascular abnormality. Electronically Signed   By: Genevive Bi M.D.   On: 01/04/2021 12:07   MR PELVIS W WO CONTRAST  Result Date: 01/03/2021 CLINICAL DATA:  Abdominal abscess constipation rectal EXAM: MRI PELVIS WITHOUT AND WITH CONTRAST TECHNIQUE: Multiplanar multisequence MR imaging of the  pelvis was performed both before and after administration of intravenous contrast. CONTRAST:  7mL GADAVIST GADOBUTROL 1 MMOL/ML IV SOLN COMPARISON:  CT abdomen pelvis 01/02/2021 FINDINGS: Urinary Tract:  No abnormality visualized. Bowel: There is a rim enhancing perianal abscess posterior to the anal canal, 6 o'clock face in lithotomy position, measuring approximately 3.8 x 3.4 x 1.9 cm (series 8, image 30, series 3, image 36). Otherwise unremarkable visualized pelvic bowel loops. Vascular/Lymphatic: Small, prominent, reactive perirectal lymph nodes (series 9, image 18). No significant vascular abnormality seen. Reproductive:  No mass or other significant abnormality Other:  None. Musculoskeletal: No suspicious bone lesions identified. IMPRESSION: 1. Rim enhancing perianal abscess posterior to the anal canal, 6 o'clock face in lithotomy position, measuring approximately 3.8 x 3.4 x 1.9 cm. 2. Small, prominent, reactive perirectal lymph nodes. Preliminary findings were documented by Dr. Gwenyth Bender. Electronically Signed   By: Lauralyn Primes M.D.   On: 01/03/2021 08:19   CT ABDOMEN PELVIS W CONTRAST  Result Date: 01/02/2021 CLINICAL DATA:  Abdominal pain and fever. Smoker. Nurse triage notes describe rectal pain and lack of ability to have bowel movements. EXAM: CT ABDOMEN AND PELVIS WITH CONTRAST TECHNIQUE: Multidetector CT imaging of the abdomen and pelvis was performed using the standard protocol following bolus administration of intravenous contrast. CONTRAST:  OMNIPAQUE IOHEXOL 300 MG/ML  SOLN COMPARISON:  None. FINDINGS: Lower chest: Right base scarring. Right lower lobe 5 mm pulmonary nodule on 15/5. 4 mm left lower lobe pulmonary nodule. Normal heart size without pericardial or pleural effusion. Hepatobiliary: Hepatomegaly at 22.2 cm craniocaudal. Mild caudate lobe enlargement. Normal gallbladder, without biliary ductal dilatation. Pancreas: Normal, without mass or ductal dilatation. Spleen: Normal in  size, without focal abnormality. Adrenals/Urinary Tract: Normal adrenal glands. Normal kidneys, without hydronephrosis. Normal urinary bladder. Stomach/Bowel: Normal stomach, without wall thickening. Normal appendix, and terminal ileum. Normal small bowel. Subtle hypoattenuation within the deep pelvis including at 2.9 by 2.0 cm on 90/3. Favored to be positioned along the posterior wall of the anus. Also sagittal image 93. Vascular/Lymphatic: Aortic atherosclerosis. No abdominopelvic adenopathy. Reproductive: Normal prostate. Other: No significant free fluid. Musculoskeletal: No acute osseous abnormality. IMPRESSION: 1. Subtle hypoattenuation within the deep pelvis, suspicious for perianal/perirectal infection and developing abscess. This area is sub optimally evaluated by CT. Recommend physical exam correlation. High-resolution MRI on a nonemergent basis would likely be of increased accuracy. 2. Hepatomegaly.  Mild caudate lobe enlargement is nonspecific. 3. Bibasilar pulmonary nodules. No follow-up needed if patient is low-risk. Non-contrast chest CT can be considered in 12 months if patient is high-risk. This recommendation follows the consensus statement: Guidelines for Management of Incidental Pulmonary Nodules Detected on CT Images: From the Fleischner Society 2017; Radiology 2017; 284:228-243. Electronically Signed   By: Jeronimo Greaves M.D.   On: 01/02/2021 18:51   DG CHEST PORT 1 VIEW  Result Date: 01/08/2021 CLINICAL DATA:  Hypoxia. EXAM: PORTABLE CHEST 1 VIEW COMPARISON:  January 06, 2021. FINDINGS: The heart size and mediastinal contours are within normal limits. No pneumothorax or pleural effusion is noted. Patchy diffuse airspace opacities are noted most consistent with multifocal pneumonia. The visualized skeletal structures are unremarkable. IMPRESSION: Stable bilateral patchy  airspace opacities concerning for multifocal pneumonia. Electronically Signed   By: Lupita RaiderJames  Green Jr M.D.   On: 01/08/2021 13:19    DG CHEST PORT 1 VIEW  Result Date: 01/06/2021 CLINICAL DATA:  Follow-up pneumonia EXAM: PORTABLE CHEST 1 VIEW COMPARISON:  01/04/2021 FINDINGS: Cardiac shadow is stable. Diffuse bilateral airspace opacities are again identified throughout both lungs worsened in the interval from the prior exam. No sizable effusion is seen. No pneumothorax is noted. No bony abnormality is noted. IMPRESSION: Increasing airspace opacity bilaterally Electronically Signed   By: Alcide CleverMark  Lukens M.D.   On: 01/06/2021 08:43   DG CHEST PORT 1 VIEW  Result Date: 01/04/2021 CLINICAL DATA:  Respiratory failure EXAM: PORTABLE CHEST 1 VIEW COMPARISON:  01/04/2021 FINDINGS: Diffuse bilateral airspace disease . Normal cardiac silhouette. No pleural fluid. No pneumothorax. IMPRESSION: Diffuse bilateral airspace disease consistent with multifocal pneumonia versus pulmonary edema. Electronically Signed   By: Genevive BiStewart  Edmunds M.D.   On: 01/04/2021 11:12   ECHOCARDIOGRAM COMPLETE  Result Date: 01/06/2021    ECHOCARDIOGRAM REPORT   Patient Name:   Trevor GlennFRANCISCO Dixon Date of Exam: 01/06/2021 Medical Rec #:  [id number]        Height:       66.0 in Accession #:    [id number]       Weight:       150.0 lb Date of Birth:  08/07/1980         BSA:          1.770 m Patient Age:    40 years         BP:           119/71 mmHg Patient Gender: M                HR:           88 bpm. Exam Location:  Inpatient Procedure: 2D Echo, 3D Echo, Cardiac Doppler and Color Doppler Indications:    Hypoxia  History:        Patient has no prior history of Echocardiogram examinations.                 Signs/Symptoms:Dyspnea and Shortness of Breath. ETOH.  Sonographer:    Sheralyn Boatmanina West RDCS Referring Phys: [phone] PAULA B SIMPSON IMPRESSIONS  1. Left ventricular ejection fraction, by estimation, is 55 to 60%. The left ventricle has normal function. The left ventricle has no regional wall motion abnormalities. Left ventricular diastolic parameters were normal.  2. Right ventricular  systolic function is normal. The right ventricular size is normal.  3. The mitral valve is normal in structure. No evidence of mitral valve regurgitation. No evidence of mitral stenosis.  4. The aortic valve is normal in structure. Aortic valve regurgitation is not visualized. No aortic stenosis is present.  5. The inferior vena cava is normal in size with greater than 50% respiratory variability, suggesting right atrial pressure of 3 mmHg. FINDINGS  Left Ventricle: Left ventricular ejection fraction, by estimation, is 55 to 60%. The left ventricle has normal function. The left ventricle has no regional wall motion abnormalities. The left ventricular internal cavity size was normal in size. There is  no left ventricular hypertrophy. Left ventricular diastolic parameters were normal. Right Ventricle: The right ventricular size is normal. No increase in right ventricular wall thickness. Right ventricular systolic function is normal. Left Atrium: Left atrial size was normal in size. Right Atrium: Right atrial size was normal in size. Pericardium: There is no evidence of pericardial effusion.  Mitral Valve: The mitral valve is normal in structure. No evidence of mitral valve regurgitation. No evidence of mitral valve stenosis. Tricuspid Valve: The tricuspid valve is normal in structure. Tricuspid valve regurgitation is not demonstrated. No evidence of tricuspid stenosis. Aortic Valve: The aortic valve is normal in structure. Aortic valve regurgitation is not visualized. No aortic stenosis is present. Pulmonic Valve: The pulmonic valve was normal in structure. Pulmonic valve regurgitation is not visualized. No evidence of pulmonic stenosis. Aorta: The aortic root is normal in size and structure. Venous: The inferior vena cava is normal in size with greater than 50% respiratory variability, suggesting right atrial pressure of 3 mmHg. IAS/Shunts: No atrial level shunt detected by color flow Doppler.  LEFT VENTRICLE PLAX 2D  LVIDd:         4.90 cm      Diastology LVIDs:         3.60 cm      LV e' medial:    8.59 cm/s LV PW:         1.30 cm      LV E/e' medial:  13.0 LV IVS:        1.00 cm      LV e' lateral:   12.70 cm/s LVOT diam:     1.70 cm      LV E/e' lateral: 8.8 LV SV:         47 LV SV Index:   27 LVOT Area:     2.27 cm  LV Volumes (MOD) LV vol d, MOD A2C: 98.8 ml LV vol d, MOD A4C: 100.0 ml LV vol s, MOD A2C: 38.4 ml LV vol s, MOD A4C: 43.5 ml LV SV MOD A2C:     60.4 ml LV SV MOD A4C:     100.0 ml LV SV MOD BP:      59.6 ml RIGHT VENTRICLE             IVC RV S prime:     10.70 cm/s  IVC diam: 2.10 cm TAPSE (M-mode): 1.7 cm LEFT ATRIUM             Index       RIGHT ATRIUM           Index LA diam:        3.10 cm 1.75 cm/m  RA Area:     16.10 cm LA Vol (A2C):   22.3 ml 12.60 ml/m RA Volume:   43.50 ml  24.58 ml/m LA Vol (A4C):   29.9 ml 16.90 ml/m LA Biplane Vol: 26.3 ml 14.86 ml/m  AORTIC VALVE LVOT Vmax:   103.00 cm/s LVOT Vmean:  68.400 cm/s LVOT VTI:    0.209 m  AORTA Ao Root diam: 2.90 cm Ao Asc diam:  2.70 cm MITRAL VALVE MV Area (PHT): 8.25 cm     SHUNTS MV Decel Time: 92 msec      Systemic VTI:  0.21 m MV E velocity: 112.00 cm/s  Systemic Diam: 1.70 cm MV A velocity: 61.80 cm/s MV E/A ratio:  1.81 Donato Schultz MD Electronically signed by Donato Schultz MD Signature Date/Time: 01/06/2021/12:58:10 PM    Final    US Abdomen Limited RUQ (LIVER/GB)  Result Date: 01/03/2021 CLINICAL DATA:  Elevated LFTs EXAM: ULTRASOUND ABDOMEN LIMITED RIGHT UPPER QUADRANT COMPARISON:  CT from the previous day. FINDINGS: Gallbladder: No gallstones or wall thickening visualized. No sonographic Murphy sign noted by sonographer. Common bile duct: Diameter: 3.1 mm. Liver: Slight increased echogenicity is noted consistent  with fatty infiltration. No mass is seen. Portal vein is patent on color Doppler imaging with normal direction of blood flow towards the liver. Other: None. IMPRESSION: Fatty liver. No other focal abnormality is noted.  Electronically Signed   By: Alcide Clever M.D.   On: 01/03/2021 00:54      Subjective: Some dyspnea with cough, otherwise, feels well.  Discharge Exam: Vitals:   01/14/21 0500 01/14/21 0515  BP:    Pulse: 68 60  Resp: 19 18  Temp:    SpO2: (!) 89% 90%   Vitals:   01/14/21 0400 01/14/21 0445 01/14/21 0500 01/14/21 0515  BP: (!) 99/57 (!) 111/59    Pulse: 77  68 60  Resp: 17  19 18   Temp:  98.3 F (36.8 C)    TempSrc:  Oral    SpO2: 92%  (!) 89% 90%  Weight:      Height:        General: Pt is alert, awake, not in acute distress Cardiovascular: RRR, S1/S2 +, no rubs, no gallops Respiratory: Mild rales at bases, no wheezing, no rhonchi Abdominal: Soft, NT, ND, bowel sounds + Extremities: no edema, no cyanosis    The results of significant diagnostics from this hospitalization (including imaging, microbiology, ancillary and laboratory) are listed below for reference.     Labs: BNP (last 3 results) Recent Labs    01/05/21 1327 01/06/21 0251  BNP 135.4* 77.1   Basic Metabolic Panel: Recent Labs  Lab 01/09/21 0200 01/10/21 0007 01/11/21 0202 01/12/21 0114 01/13/21 0319  NA 134* 133* 133* 131* 130*  K 3.6 3.5 3.3* 3.9 3.6  CL 104 102 103 103 100  CO2 24 25 23  21* 21*  GLUCOSE 91 104* 90 123* 108*  BUN 7 10 9 7 11   CREATININE 0.66 0.73 0.63 0.66 0.60*  CALCIUM 8.4* 8.4* 8.3* 8.5* 8.6*   CBC: Recent Labs  Lab 01/08/21 0805 01/10/21 0007 01/13/21 0319  WBC 11.2* 12.4* 15.8*  HGB 11.9* 12.2* 12.6*  HCT 36.3* 36.1* 37.5*  MCV 93.8 93.8 92.8  PLT 332 330 435*   Urinalysis    Component Value Date/Time   COLORURINE AMBER (A) 01/02/2021 1135   APPEARANCEUR CLEAR 01/02/2021 1135   LABSPEC 1.025 01/02/2021 1135   PHURINE 6.0 01/02/2021 1135   GLUCOSEU NEGATIVE 01/02/2021 1135   HGBUR TRACE (A) 01/02/2021 1135   BILIRUBINUR NEGATIVE 01/02/2021 1135   KETONESUR NEGATIVE 01/02/2021 1135   PROTEINUR 30 (A) 01/02/2021 1135   NITRITE NEGATIVE 01/02/2021  1135   LEUKOCYTESUR NEGATIVE 01/02/2021 1135    Time coordinating discharge: 35 minutes  SIGNED:   01/04/2021, MD [facility] 01/14/2021, 4:20 PM

## 2021-01-14 NOTE — Progress Notes (Signed)
SATURATION QUALIFICATIONS: (This note is used to comply with regulatory documentation for home oxygen)  Patient Saturations on Room Air at Rest = 96%  Patient Saturations on Room Air while Ambulating = 90%   

## 2021-01-15 DIAGNOSIS — J189 Pneumonia, unspecified organism: Secondary | ICD-10-CM

## 2022-02-22 IMAGING — DX DG CHEST 1V PORT
1 series · 1 of 1 positions shown · non-contrast
Comparison: 01/04/2021

CLINICAL DATA: Follow-up pneumonia

EXAM:
PORTABLE CHEST 1 VIEW

[chest ap]
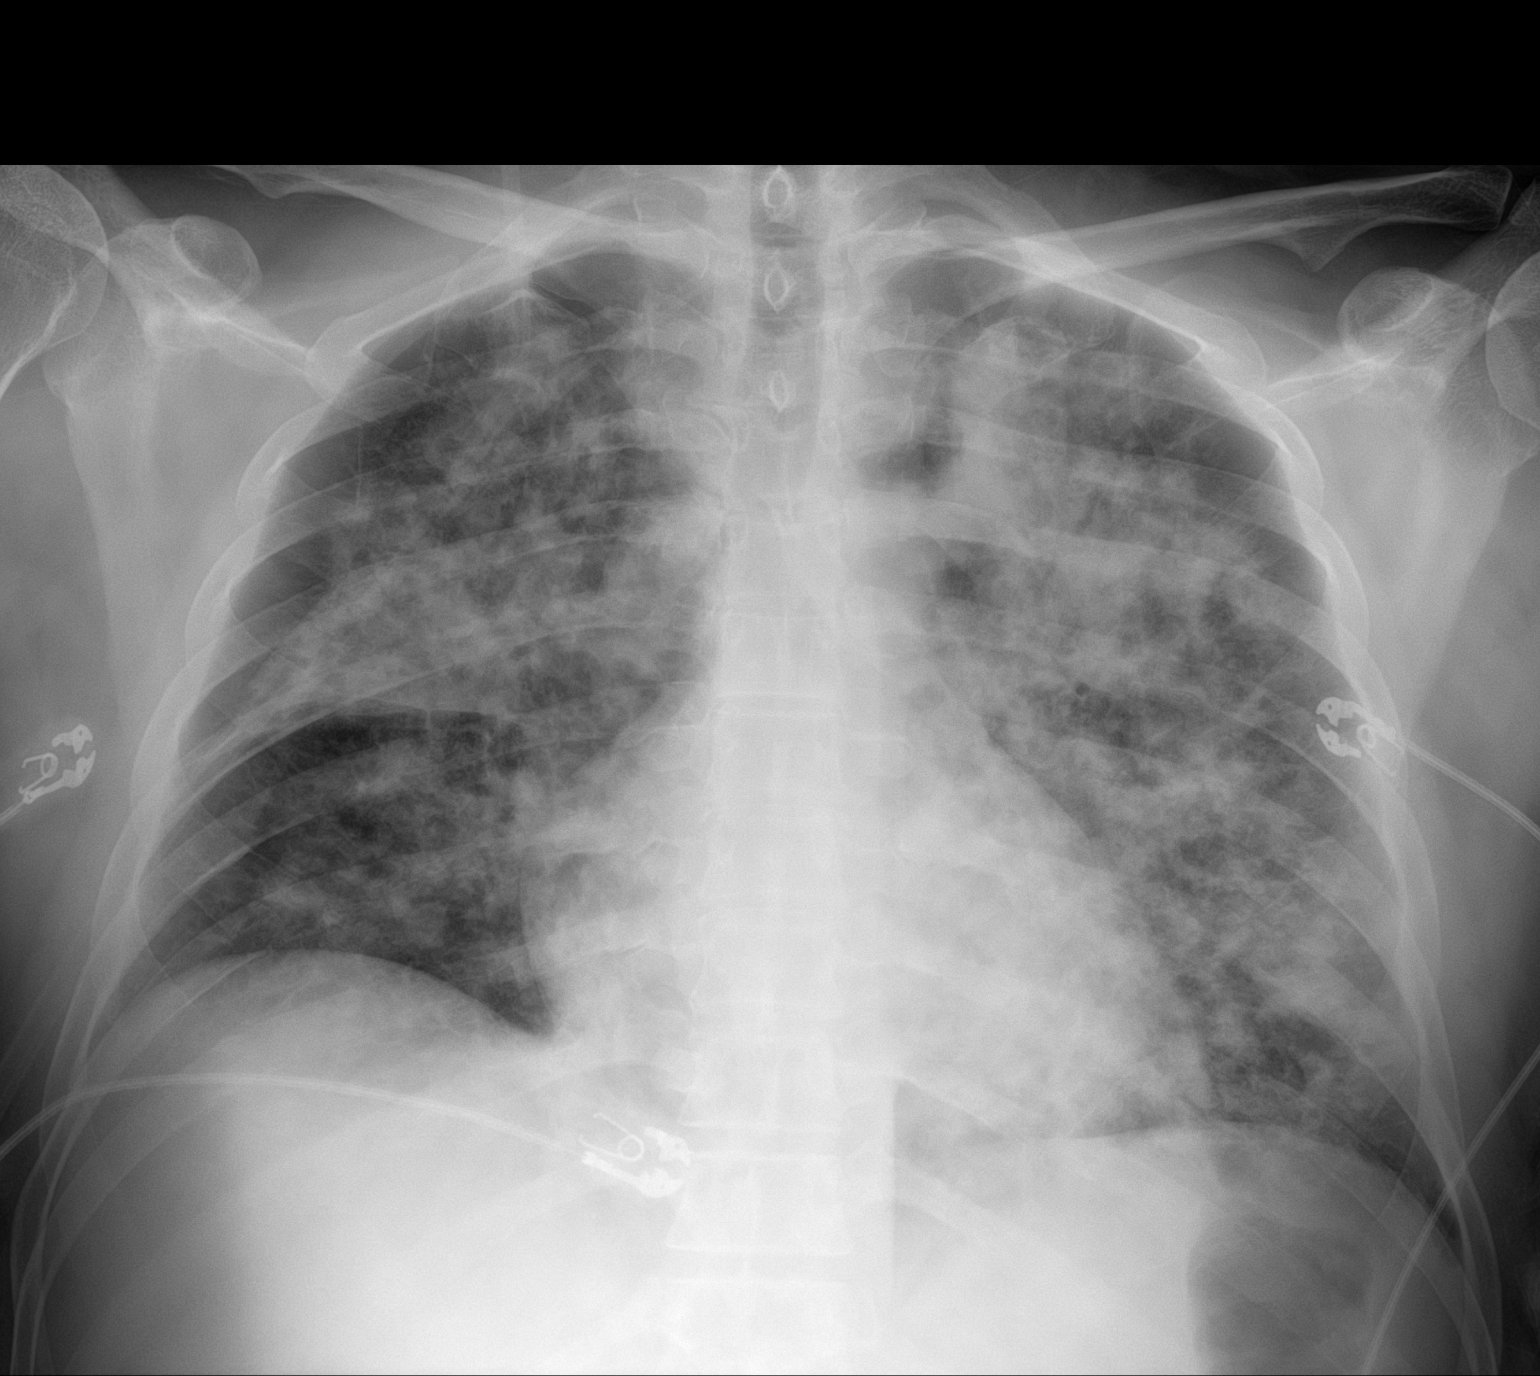

[1 of 1 positions shown; findings below may reference images not displayed]

FINDINGS: Cardiac shadow is stable. Diffuse bilateral airspace opacities are
again identified throughout both lungs worsened in the interval from
the prior exam. No sizable effusion is seen. No pneumothorax is
noted. No bony abnormality is noted.
IMPRESSION: Increasing airspace opacity bilaterally

## 2022-02-24 IMAGING — DX DG CHEST 1V PORT
1 series · 1 of 1 positions shown · non-contrast
Comparison: January 06, 2021.

CLINICAL DATA: Hypoxia.

EXAM:
PORTABLE CHEST 1 VIEW

[chest]
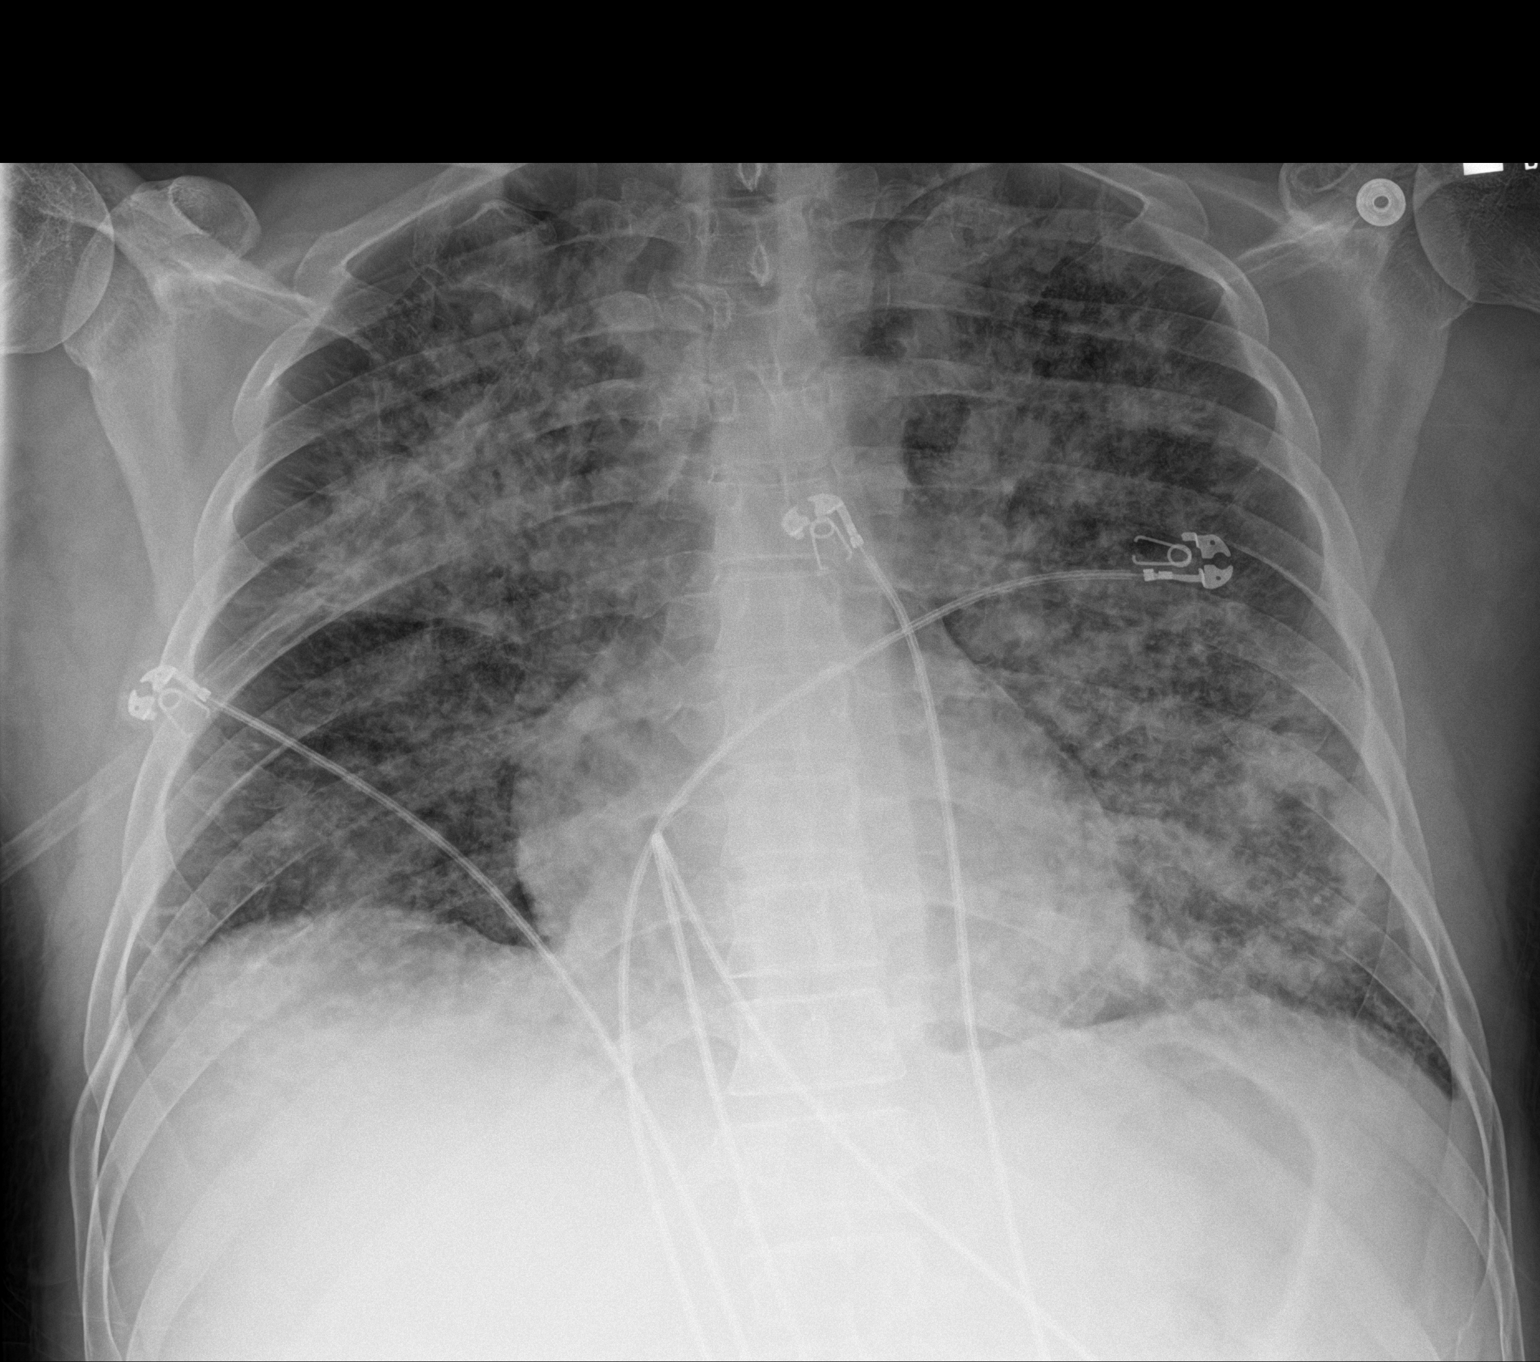

[1 of 1 positions shown; findings below may reference images not displayed]

FINDINGS: The heart size and mediastinal contours are within normal limits. No
pneumothorax or pleural effusion is noted. Patchy diffuse airspace
opacities are noted most consistent with multifocal pneumonia. The
visualized skeletal structures are unremarkable.
IMPRESSION: Stable bilateral patchy airspace opacities concerning for multifocal
pneumonia.
